# Patient Record
Sex: Female | Born: 1950
Health system: Southern US, Community
[De-identification: ages and names within clinical notes are randomized; demographics above are authoritative.]

## PROBLEM LIST (undated history)

## (undated) DIAGNOSIS — Z8669 Personal history of other diseases of the nervous system and sense organs: Secondary | ICD-10-CM

## (undated) DIAGNOSIS — I10 Essential (primary) hypertension: Secondary | ICD-10-CM

## (undated) DIAGNOSIS — E785 Hyperlipidemia, unspecified: Secondary | ICD-10-CM

## (undated) HISTORY — DX: Personal history of other diseases of the nervous system and sense organs: Z86.69

## (undated) HISTORY — DX: Hyperlipidemia, unspecified: E78.5

## (undated) HISTORY — DX: Essential (primary) hypertension: I10

---

## 1982-05-18 HISTORY — PX: CHOLECYSTECTOMY: SHX55

## 2005-04-16 ENCOUNTER — Ambulatory Visit: Payer: Self-pay | Admitting: Obstetrics and Gynecology

## 2006-05-28 ENCOUNTER — Ambulatory Visit: Payer: Self-pay | Admitting: Obstetrics and Gynecology

## 2006-11-25 ENCOUNTER — Ambulatory Visit: Payer: Self-pay | Admitting: Emergency Medicine

## 2006-11-27 ENCOUNTER — Ambulatory Visit: Payer: Self-pay | Admitting: Emergency Medicine

## 2007-08-16 ENCOUNTER — Ambulatory Visit: Payer: Self-pay | Admitting: Obstetrics and Gynecology

## 2008-09-03 ENCOUNTER — Ambulatory Visit: Payer: Self-pay | Admitting: Obstetrics and Gynecology

## 2009-10-29 ENCOUNTER — Ambulatory Visit: Payer: Self-pay | Admitting: Obstetrics and Gynecology

## 2009-11-11 ENCOUNTER — Ambulatory Visit: Payer: Self-pay | Admitting: Obstetrics and Gynecology

## 2011-08-13 ENCOUNTER — Ambulatory Visit: Payer: Self-pay | Admitting: Family Medicine

## 2011-08-13 LAB — URINALYSIS, COMPLETE
Ketone: NEGATIVE
Nitrite: NEGATIVE

## 2011-08-26 ENCOUNTER — Ambulatory Visit (INDEPENDENT_AMBULATORY_CARE_PROVIDER_SITE_OTHER): Payer: BC Managed Care – PPO | Admitting: Internal Medicine

## 2011-08-26 ENCOUNTER — Encounter: Payer: Self-pay | Admitting: Internal Medicine

## 2011-08-26 VITALS — BP 122/64 | HR 60 | Temp 98.7°F | Resp 16 | Ht 62.0 in | Wt 184.2 lb

## 2011-08-26 DIAGNOSIS — I10 Essential (primary) hypertension: Secondary | ICD-10-CM

## 2011-08-26 DIAGNOSIS — Z79899 Other long term (current) drug therapy: Secondary | ICD-10-CM

## 2011-08-26 DIAGNOSIS — E785 Hyperlipidemia, unspecified: Secondary | ICD-10-CM

## 2011-08-26 DIAGNOSIS — R05 Cough: Secondary | ICD-10-CM

## 2011-08-26 DIAGNOSIS — E663 Overweight: Secondary | ICD-10-CM | POA: Insufficient documentation

## 2011-08-26 DIAGNOSIS — T502X5A Adverse effect of carbonic-anhydrase inhibitors, benzothiadiazides and other diuretics, initial encounter: Secondary | ICD-10-CM

## 2011-08-26 DIAGNOSIS — E669 Obesity, unspecified: Secondary | ICD-10-CM

## 2011-08-26 DIAGNOSIS — N390 Urinary tract infection, site not specified: Secondary | ICD-10-CM

## 2011-08-26 DIAGNOSIS — E876 Hypokalemia: Secondary | ICD-10-CM

## 2011-08-26 LAB — BASIC METABOLIC PANEL
CO2: 30 mEq/L (ref 19–32)
GFR: 82.78 mL/min (ref >60.00)
Glucose, Bld: 82 mg/dL (ref 70–99)
Potassium: 3 mEq/L — ABNORMAL LOW (ref 3.5–5.1)
Sodium: 139 mEq/L (ref 135–145)

## 2011-08-26 NOTE — Progress Notes (Signed)
Patient ID: Alexandria Graves, female   DOB: 11-19-1950, 61 y.o.   MRN: [id number]   Patient Active Problem List  Diagnoses  . Obesity (BMI 30-39.9)  . Cough  . Diuretic-induced hypokalemia  . History of migraine headaches  . Hypertension  . Hyperlipidemia    Subjective:  CC:   Chief Complaint  Patient presents with  . Follow-up    HPI:   Alexandria Graves a 61 y.o. female who presents ffor ollow up on hypertension, hypertriglyceridemia and overweight,  Last seen July 2012 for annual physical. Recent episode of hematuria,  incontinence and URI treated by Urgent Care with augmentin on Good Friday when office was closed.  Here today to recheck.  Treated once before for urinary  urgency by Dr. Alison Murray .  Unclear if cultures were done either time.    She has no history of prior hematuria or stones.   Still has a slight cough, but thinks her symptoms are due to chronic  PND. o fevers, chills, purulent sputum, and all urinary symptoms have resolved.    Past Medical History  Diagnosis Date  . History of migraine headaches   . Hypertension   . Hyperlipidemia     Past Surgical History  Procedure Date  . Cholecystectomy 1984         The following portions of the patient's history were reviewed and updated as appropriate: Allergies, current medications, and problem list.    Review of Systems:   12 Pt  review of systems was negative except those addressed in the HPI,     History   Social History  . Marital Status: Married    Spouse Name: N/A    Number of Children: N/A  . Years of Education: N/A   Occupational History  . Not on file.   Social History Main Topics  . Smoking status: Former Smoker    Quit date: 08/26/1971  . Smokeless tobacco: Never Used  . Alcohol Use: Yes  . Drug Use: No  . Sexually Active: Not on file   Other Topics Concern  . Not on file   Social History Narrative  . No narrative on file    Objective:  BP 122/64  Pulse 60  Temp(Src) 98.7  F (37.1 C) (Oral)  Resp 16  Ht 5\' 2"  (1.575 m)  Wt 184 lb 4 oz (83.575 kg)  BMI 33.70 kg/m2  SpO2 99%  General appearance: alert, cooperative and appears stated age Ears: normal TM's and external ear canals both ears Throat: lips, mucosa, and tongue normal; teeth and gums normal Neck: no adenopathy, no carotid bruit, supple, symmetrical, trachea midline and thyroid not enlarged, symmetric, no tenderness/mass/nodules Back: symmetric, no curvature. ROM normal. No CVA tenderness. Lungs: clear to auscultation bilaterally Heart: regular rate and rhythm, S1, S2 normal, no murmur, click, rub or gallop Abdomen: soft, non-tender; bowel sounds normal; no masses,  no organomegaly Pulses: 2+ and symmetric Skin: Skin color, texture, turgor normal. No rashes or lesions Lymph nodes: Cervical, supraclavicular, and axillary nodes normal.  Assessment and Plan:  Obesity (BMI 30-39.9) Not exercising ,  Has lost 9 lbs. Not counting calories .  I have addressed  BMI and recommended a low glycemic index diet utilizing smaller more frequent meals to increase metabolism.  I have also recommended that patient start exercising with a goal of 30 minutes of aerobic exercise a minimum of 5 days per week.   Hypertension Well controlled on current medications.  No changes today. She is overdue for  BMET  Diuretic-induced hypokalemia Changed from hctz to maxzide for hypokalemia seen on today's BMET  Cough Nonproductive.  Will treat for PND and GERD with saline lavage and H2 blocker .   Hyperlipidemia Mild, managed with fish oil.  Last lipid panel in July was : LDL 111 trigs 103 HDL  54.     Updated Medication List Outpatient Encounter Prescriptions as of 08/26/2011  Medication Sig Dispense Refill  . aspirin 81 MG tablet Take 81 mg by mouth daily.      . cetirizine (ZYRTEC) 10 MG tablet Take 10 mg by mouth daily.      . potassium chloride (K-DUR) 10 MEQ tablet Take 1 tablet (10 mEq total) by mouth 2 (two)  times daily.  14 tablet  0  . propranolol ER (INDERAL LA) 120 MG 24 hr capsule Take by mouth daily. 160 mg      . triamterene-hydrochlorothiazide (MAXZIDE-25) 37.5-25 MG per tablet Take 1 each (1 tablet total) by mouth daily.  30 tablet  3  . DISCONTD: hydrochlorothiazide (HYDRODIURIL) 25 MG tablet Take 25 mg by mouth daily.         Orders Placed This Encounter  Procedures  . CULTURE, URINE COMPREHENSIVE  . HM MAMMOGRAPHY  . Basic metabolic panel  . Basic metabolic panel  . HM PAP SMEAR  . POCT Urinalysis Dipstick  . HM COLONOSCOPY    No Follow-up on file.

## 2011-08-26 NOTE — Patient Instructions (Addendum)
Consider the Low Glycemic Index Diet and 6 smaller meals daily :   7 AM Low carbohydrate Protein  Shakes (EAS Carb Control  Or Atkins ,  Available everywhere,   In  cases at [employer] )  2.5 carbs  (Add or substitute a toasted sandwhich thin w/ peanut butter)  10 AM: Protein bar by Atkins (snack size,  Chocolate lover's variety at  [employer])    Lunch: sandwich on pita bread or flatbread (Joseph's makes a pita bread and a flat bread , available at [employer] and [employer]; Toufayah makes a low carb flatbread available at [employer] and [employer])   3 PM:  Mid day :  Another protein bar,  Or a  cheese stick, 1/4 cup of almonds, walnuts, pistachios, pecans, peanuts,  Macadamia nuts  6 PM  Dinner:  "mean and green:"  Meat/chicken/fish, salad, and green veggie : use ranch, vinagrette,  Blue cheese, etc  9 PM snack : Breyer's low carb fudgiscle or  ice cream bar (Carb Smart) Weight Watcher's ice cream bar , or another protein shake \  Try simply saline twice daily  And generic pepcid 20 mg once daily n the evening for reflux to see if cough improves.Ok to continue Delsym.

## 2011-08-26 NOTE — Assessment & Plan Note (Addendum)
Not exercising ,  Has lost 9 lbs. Not counting calories .  I have addressed  BMI and recommended a low glycemic index diet utilizing smaller more frequent meals to increase metabolism.  I have also recommended that patient start exercising with a goal of 30 minutes of aerobic exercise a minimum of 5 days per week.

## 2011-08-27 ENCOUNTER — Other Ambulatory Visit: Payer: Self-pay | Admitting: Internal Medicine

## 2011-08-27 ENCOUNTER — Encounter: Payer: Self-pay | Admitting: Internal Medicine

## 2011-08-27 DIAGNOSIS — E876 Hypokalemia: Secondary | ICD-10-CM | POA: Insufficient documentation

## 2011-08-27 DIAGNOSIS — Z8669 Personal history of other diseases of the nervous system and sense organs: Secondary | ICD-10-CM | POA: Insufficient documentation

## 2011-08-27 DIAGNOSIS — E785 Hyperlipidemia, unspecified: Secondary | ICD-10-CM | POA: Insufficient documentation

## 2011-08-27 DIAGNOSIS — I10 Essential (primary) hypertension: Secondary | ICD-10-CM | POA: Insufficient documentation

## 2011-08-27 MED ORDER — POTASSIUM CHLORIDE ER 10 MEQ PO TBCR: 10.0000 meq | EXTENDED_RELEASE_TABLET | Freq: Two times a day (BID) | ORAL | Status: DC

## 2011-08-27 MED ORDER — TRIAMTERENE-HCTZ 37.5-25 MG PO TABS: 1.0000 | ORAL_TABLET | Freq: Every day | ORAL | Status: DC

## 2011-08-27 NOTE — Assessment & Plan Note (Signed)
Changed from hctz to maxzide for hypokalemia seen on today's BMET

## 2011-08-27 NOTE — Assessment & Plan Note (Signed)
Mild, managed with fish oil.  Last lipid panel in July was : LDL 111 trigs 103 HDL  54.

## 2011-08-27 NOTE — Assessment & Plan Note (Addendum)
Well controlled on current medications.  No changes today. She is overdue for BMET

## 2011-08-27 NOTE — Assessment & Plan Note (Signed)
Nonproductive.  Will treat for PND and GERD with saline lavage and H2 blocker .

## 2011-08-28 ENCOUNTER — Telehealth: Payer: Self-pay | Admitting: *Deleted

## 2011-08-28 LAB — POCT URINALYSIS DIPSTICK
Bilirubin, UA: NEGATIVE
Blood, UA: NEGATIVE
Glucose, UA: NEGATIVE
Nitrite, UA: NEGATIVE
Urobilinogen, UA: 0.2
pH, UA: 7

## 2011-08-28 NOTE — Telephone Encounter (Signed)
Patient's urine was sent for culture.

## 2011-08-28 NOTE — Telephone Encounter (Signed)
Ok, can you confirm that is it sent for culture? thanks

## 2011-08-28 NOTE — Telephone Encounter (Signed)
The urinalysis should show up now. I'm not sure why it didn't release.

## 2011-08-31 LAB — CULTURE, URINE COMPREHENSIVE: Colony Count: 50000

## 2011-09-10 ENCOUNTER — Encounter: Payer: Self-pay | Admitting: Internal Medicine

## 2011-09-22 ENCOUNTER — Other Ambulatory Visit (INDEPENDENT_AMBULATORY_CARE_PROVIDER_SITE_OTHER): Payer: BC Managed Care – PPO | Admitting: *Deleted

## 2011-09-22 DIAGNOSIS — E876 Hypokalemia: Secondary | ICD-10-CM

## 2011-09-22 DIAGNOSIS — T502X5A Adverse effect of carbonic-anhydrase inhibitors, benzothiadiazides and other diuretics, initial encounter: Secondary | ICD-10-CM

## 2011-09-22 LAB — BASIC METABOLIC PANEL
BUN: 14 mg/dL (ref 6–23)
CO2: 31 mEq/L (ref 19–32)
Chloride: 104 mEq/L (ref 96–112)
Creatinine, Ser: 0.8 mg/dL (ref 0.4–1.2)
Glucose, Bld: 105 mg/dL — ABNORMAL HIGH (ref 70–99)

## 2011-09-23 ENCOUNTER — Other Ambulatory Visit: Payer: BC Managed Care – PPO

## 2011-11-05 ENCOUNTER — Ambulatory Visit: Payer: Self-pay | Admitting: Obstetrics and Gynecology

## 2011-12-12 LAB — HM PAP SMEAR: HM Pap smear: NORMAL

## 2012-02-23 ENCOUNTER — Other Ambulatory Visit: Payer: Self-pay | Admitting: Internal Medicine

## 2012-02-23 DIAGNOSIS — E876 Hypokalemia: Secondary | ICD-10-CM

## 2012-02-23 MED ORDER — PROPRANOLOL HCL ER 120 MG PO CP24: 120.0000 mg | ORAL_CAPSULE | Freq: Every day | ORAL | Status: DC

## 2012-02-23 NOTE — Telephone Encounter (Signed)
Patient had to cancel her appointment this Friday because her father was diagnosed with cancer and she has to take him to the doctor. She is needing her prescriptions refilled before she can be seen in the office. Refill on Propranolol Er 120 mg cap time release. HCTZ . 25 mg. Send to [facility].

## 2012-02-23 NOTE — Telephone Encounter (Signed)
Patient had to cancel her appointment this Friday because her father was diagnosed with cancer and she has to take him to the doctor. She is needing her prescriptions refilled before she can be seen in the office. °Refill on Propranolol Er 120 mg cap time release. HCTZ . 25 mg. Send to [facility]. °

## 2012-02-24 ENCOUNTER — Other Ambulatory Visit: Payer: Self-pay | Admitting: Internal Medicine

## 2012-02-24 ENCOUNTER — Telehealth: Payer: Self-pay | Admitting: Internal Medicine

## 2012-02-24 MED ORDER — PROPRANOLOL HCL ER 120 MG PO CP24: 120.0000 mg | ORAL_CAPSULE | Freq: Every day | ORAL | Status: DC

## 2012-02-24 NOTE — Telephone Encounter (Signed)
Received fax from [facility] asking [place] to confirm Propranolol ER. I have put this form in your red folder for you to fill out and then we can fax back.

## 2012-02-26 ENCOUNTER — Ambulatory Visit: Payer: BC Managed Care – PPO | Admitting: Internal Medicine

## 2012-03-03 MED ORDER — TRIAMTERENE-HCTZ 37.5-25 MG PO TABS: 1.0000 | ORAL_TABLET | Freq: Every day | ORAL | Status: DC

## 2012-03-03 NOTE — Telephone Encounter (Signed)
Rhonda  Per your note you sent a refill of HCTZ to [facility],.  This patient does not take HCTZ,  She takes triamterene/HTCZ 37.5/25mg ., since April 2013.  I have sent to [facility].  You did not update [facility] when you sent so I cannot tell whether you sent HCZ or not.  You will need to call them and cancel HTCZ if so.

## 2012-03-03 NOTE — Addendum Note (Signed)
Addended by: Sherlene Shams on: 03/03/2012 05:29 PM   Modules accepted: Orders

## 2012-03-07 ENCOUNTER — Other Ambulatory Visit: Payer: Self-pay

## 2012-03-09 ENCOUNTER — Encounter: Payer: Self-pay | Admitting: Internal Medicine

## 2012-03-09 ENCOUNTER — Encounter: Payer: Self-pay | Admitting: *Deleted

## 2012-03-09 ENCOUNTER — Ambulatory Visit (INDEPENDENT_AMBULATORY_CARE_PROVIDER_SITE_OTHER): Payer: BC Managed Care – PPO | Admitting: Internal Medicine

## 2012-03-09 VITALS — BP 122/72 | HR 59 | Temp 98.6°F | Ht 62.0 in | Wt 188.0 lb

## 2012-03-09 DIAGNOSIS — K222 Esophageal obstruction: Secondary | ICD-10-CM | POA: Insufficient documentation

## 2012-03-09 DIAGNOSIS — F419 Anxiety disorder, unspecified: Secondary | ICD-10-CM

## 2012-03-09 DIAGNOSIS — I10 Essential (primary) hypertension: Secondary | ICD-10-CM

## 2012-03-09 DIAGNOSIS — R1310 Dysphagia, unspecified: Secondary | ICD-10-CM

## 2012-03-09 DIAGNOSIS — F411 Generalized anxiety disorder: Secondary | ICD-10-CM

## 2012-03-09 DIAGNOSIS — E669 Obesity, unspecified: Secondary | ICD-10-CM

## 2012-03-09 LAB — COMPREHENSIVE METABOLIC PANEL
AST: 19 U/L (ref 0–37)
Albumin: 3.7 g/dL (ref 3.5–5.2)
Alkaline Phosphatase: 42 U/L (ref 39–117)
BUN: 13 mg/dL (ref 6–23)
Creatinine, Ser: 0.8 mg/dL (ref 0.4–1.2)
Glucose, Bld: 101 mg/dL — ABNORMAL HIGH (ref 70–99)
Potassium: 3.8 mEq/L (ref 3.5–5.1)
Total Bilirubin: 0.5 mg/dL (ref 0.3–1.2)

## 2012-03-09 LAB — MICROALBUMIN / CREATININE URINE RATIO
Creatinine,U: 83.6 mg/dL
Microalb Creat Ratio: 1.3 mg/g (ref 0.0–30.0)
Microalb, Ur: 1.1 mg/dL (ref 0.0–1.9)

## 2012-03-09 NOTE — Patient Instructions (Addendum)
If potassium is fine today, we weil continueu hctz.  If not, we will change to the one that spares potassium.   Ask your father's  Doctor for a care managemetn consult to arrange Hospice and Palliative Care

## 2012-03-09 NOTE — Progress Notes (Signed)
Patient ID: Alexandria Graves, female   DOB: 07/06/1950, 61 y.o.   MRN: [id number]  Patient Active Problem List  Diagnosis  . Obesity (BMI 30-39.9)  . Cough  . Diuretic-induced hypokalemia  . History of migraine headaches  . Hypertension  . Hyperlipidemia  . Dysphagia    Subjective:  CC:   Chief Complaint  Patient presents with  . Follow-up    HPI:   Alexandria Goochis a 61 y.o. female who presents Followup on acute and chronic problems. For The last several days she has been feeling an external pressure on manubrium.,  And occasional heartburn.    Eating late often which aggravates it.  Accompanied by feeling that food is getting stuck on the way down.  No alcohol or tobacc use except rare alcohol.  She is distraught  Stressed out,  Father has been diagnosed with metastatic prostate cancer and was hospitalized ysterday at [facility] for septic shock.  He has asked her to let him die and  she has to make end of life decisions for him .  The physicians have not offered yet.    Past Medical History  Diagnosis Date  . History of migraine headaches   . Hypertension   . Hyperlipidemia     Past Surgical History  Procedure Date  . Cholecystectomy 1984         The following portions of the patient's history were reviewed and updated as appropriate: Allergies, current medications, and problem list.    Review of Systems:   12 Pt  review of systems was negative except those addressed in the HPI,     History   Social History  . Marital Status: Married    Spouse Name: N/A    Number of Children: N/A  . Years of Education: N/A   Occupational History  . Not on file.   Social History Main Topics  . Smoking status: Former Smoker    Quit date: 08/26/1971  . Smokeless tobacco: Never Used  . Alcohol Use: Yes  . Drug Use: No  . Sexually Active: Not on file   Other Topics Concern  . Not on file   Social History Narrative  . No narrative on file    Objective:  BP 122/72   Pulse 59  Temp 98.6 F (37 C)  Ht 5\' 2"  (1.575 m)  Wt 188 lb (85.276 kg)  BMI 34.39 kg/m2  SpO2 97%  General appearance: alert, cooperative and appears stated age Ears: normal TM's and external ear canals both ears Throat: lips, mucosa, and tongue normal; teeth and gums normal Neck: no adenopathy, no carotid bruit, supple, symmetrical, trachea midline and thyroid not enlarged, symmetric, no tenderness/mass/nodules Back: symmetric, no curvature. ROM normal. No CVA tenderness. Lungs: clear to auscultation bilaterally Heart: regular rate and rhythm, S1, S2 normal, no murmur, click, rub or gallop Abdomen: soft, non-tender; bowel sounds normal; no masses,  no organomegaly Pulses: 2+ and symmetric Skin: Skin color, texture, turgor normal. No rashes or lesions Lymph nodes: Cervical, supraclavicular, and axillary nodes normal.  Assessment and Plan: Dysphagia Suggestive of stricture versus esophageal spasm. Barium swallow ordered.  We will send the results to [facility] endoscopy. Advised to continue a PPI with OTC omeprazolae  Hypertension well controlled. Potassium is normal so we will continue hydrochlorothiazide.   Obesity (BMI 30-39.9) I have addressed  BMI and recommended a low glycemic index diet utilizing smaller more frequent meals to increase metabolism.  I have also recommended that patient start exercising  with a goal of 30 minutes of aerobic exercise a minimum of 5 days per week. Screening for diabetes shows slightly elevated cbgs, hgba1c of 60    Anxiety Spent 15 minutes discussing her to role in her father's death. Recommended that she ask her father's physician to engage care management for hospice and palliative services. She was offered an anxiolytic to help her rest but she's afraid she will sleep through important call.   Updated Medication List Outpatient Encounter Prescriptions as of 03/09/2012  Medication Sig Dispense Refill  . aspirin 81 MG  tablet Take 81 mg by mouth daily.      . cetirizine (ZYRTEC) 10 MG tablet Take 10 mg by mouth daily.      . propranolol ER (INDERAL LA) 120 MG 24 hr capsule Take 1 capsule (120 mg total) by mouth daily.  30 capsule  11  . triamterene-hydrochlorothiazide (MAXZIDE-25) 37.5-25 MG per tablet Take 1 each (1 tablet total) by mouth daily.  90 tablet  3  . DISCONTD: potassium chloride (K-DUR) 10 MEQ tablet Take 1 tablet (10 mEq total) by mouth 2 (two) times daily.  14 tablet  0

## 2012-03-11 ENCOUNTER — Encounter: Payer: Self-pay | Admitting: Internal Medicine

## 2012-03-11 DIAGNOSIS — F419 Anxiety disorder, unspecified: Secondary | ICD-10-CM | POA: Insufficient documentation

## 2012-03-11 NOTE — Assessment & Plan Note (Addendum)
I have addressed  BMI and recommended a low glycemic index diet utilizing smaller more frequent meals to increase metabolism.  I have also recommended that patient start exercising with a goal of 30 minutes of aerobic exercise a minimum of 5 days per week. Screening for diabetes shows slightly elevated cbgs, hgba1c of 60

## 2012-03-11 NOTE — Assessment & Plan Note (Signed)
well controlled  

## 2012-03-11 NOTE — Assessment & Plan Note (Addendum)
Suggestive of stricture versus esophageal spasm. Barium swallow ordered.  We will send the results to [facility] endoscopy. Advised to continue a PPI with OTC omeprazolae

## 2012-03-11 NOTE — Assessment & Plan Note (Signed)
Spent 15 minutes discussing her to role in her father's death. Recommended that she ask her father's physician to engage care management for hospice and palliative services. She was offered an anxiolytic to help her rest but she's afraid she will sleep through important call.

## 2012-03-12 ENCOUNTER — Encounter: Payer: Self-pay | Admitting: Internal Medicine

## 2012-03-24 ENCOUNTER — Ambulatory Visit: Payer: Self-pay

## 2012-03-24 LAB — RAPID STREP-A WITH REFLX: Micro Text Report: NEGATIVE

## 2012-04-25 ENCOUNTER — Encounter: Payer: Self-pay | Admitting: Internal Medicine

## 2012-04-27 MED ORDER — HYDROCHLOROTHIAZIDE 25 MG PO TABS: 25.0000 mg | ORAL_TABLET | Freq: Every day | ORAL | Status: DC

## 2012-05-03 ENCOUNTER — Encounter: Payer: Self-pay | Admitting: Internal Medicine

## 2012-05-04 ENCOUNTER — Other Ambulatory Visit: Payer: Self-pay

## 2012-05-04 MED ORDER — HYDROCHLOROTHIAZIDE 25 MG PO TABS: 25.0000 mg | ORAL_TABLET | Freq: Every day | ORAL | Status: DC

## 2012-05-04 NOTE — Telephone Encounter (Signed)
Patient requested refill on Hydrochlorthiazide 25 mg # [facility]

## 2012-05-05 MED ORDER — PROPRANOLOL HCL ER 120 MG PO CP24: 120.0000 mg | ORAL_CAPSULE | Freq: Every day | ORAL | Status: DC

## 2012-06-04 ENCOUNTER — Ambulatory Visit: Payer: Self-pay | Admitting: Family Medicine

## 2012-06-13 ENCOUNTER — Encounter: Payer: Self-pay | Admitting: Internal Medicine

## 2012-06-13 ENCOUNTER — Ambulatory Visit (INDEPENDENT_AMBULATORY_CARE_PROVIDER_SITE_OTHER): Payer: BC Managed Care – PPO | Admitting: Internal Medicine

## 2012-06-13 VITALS — BP 124/80 | HR 62 | Temp 97.8°F | Resp 16 | Wt 192.5 lb

## 2012-06-13 DIAGNOSIS — R3915 Urgency of urination: Secondary | ICD-10-CM

## 2012-06-13 DIAGNOSIS — R319 Hematuria, unspecified: Secondary | ICD-10-CM | POA: Insufficient documentation

## 2012-06-13 LAB — POCT URINALYSIS DIPSTICK
Glucose, UA: NEGATIVE
Nitrite, UA: NEGATIVE
Protein, UA: NEGATIVE
Urobilinogen, UA: 0.2

## 2012-06-13 MED ORDER — BENZONATATE 100 MG PO CAPS: 100.0000 mg | ORAL_CAPSULE | Freq: Three times a day (TID) | ORAL | Status: DC | PRN

## 2012-06-13 MED ORDER — LEVOFLOXACIN 500 MG PO TABS: 500.0000 mg | ORAL_TABLET | Freq: Every day | ORAL | Status: DC

## 2012-06-13 MED ORDER — GUAIFENESIN-CODEINE 100-10 MG/5ML PO SYRP: 5.0000 mL | ORAL_SOLUTION | Freq: Four times a day (QID) | ORAL | Status: DC | PRN

## 2012-06-13 NOTE — Progress Notes (Signed)
Patient ID: Alexandria Graves, female   DOB: Dec 10, 1950, 62 y.o.   MRN: [id number] Patient Active Problem List  Diagnosis  . Obesity (BMI 30-39.9)  . Cough  . Diuretic-induced hypokalemia  . History of migraine headaches  . Hypertension  . Hyperlipidemia  . Dysphagia  . Anxiety  . Painless hematuria    Subjective:  CC:   Chief Complaint  Patient presents with  . Urinary Tract Infection    HPI:   Alexandria Goochis a 62 y.o. female who presents with a  2 week history of intermittent hematuria.  Patients states for she has been having stress incontinence for the past 2 weeks due to bronchitis.   When she returned home from the Urgent Care visit she noticed pink staining on her  Sanitary pad.  She has had several episodes over the last week of bleeding.  She denies bladder pain,  back pain pelvic pain and dysuria.  Her last pelvic exam was normal last summer by Dr. Logan Graves and she has deferred a pelvic exam today.    2) She was treated for a viral UTI at an urgent care With tessalon and a codeine containing cough syrup which she is nearly out of.  The cough has improved but has not resolved yet and is controlled with simultaneous use of both cough suppressants.   Past Medical History  Diagnosis Date  . History of migraine headaches   . Hypertension   . Hyperlipidemia     Past Surgical History  Procedure Date  . Cholecystectomy 1984   The following portions of the patient's history were reviewed and updated as appropriate: Allergies, current medications, and problem list.    Review of Systems:   12 Pt  review of systems was negative except those addressed in the HPI,     History   Social History  . Marital Status: Married    Spouse Name: N/A    Number of Children: N/A  . Years of Education: N/A   Occupational History  . Not on file.   Social History Main Topics  . Smoking status: Former Smoker    Quit date: 08/26/1971  . Smokeless tobacco: Never Used  . Alcohol Use:  Yes  . Drug Use: No  . Sexually Active: Not on file   Other Topics Concern  . Not on file   Social History Narrative  . No narrative on file    Objective:  BP 124/80  Pulse 62  Temp 97.8 F (36.6 C) (Oral)  Resp 16  Wt 192 lb 8 oz (87.317 kg)  SpO2 96%  General appearance: alert, cooperative and appears stated age Ears: normal TM's and external ear canals both ears Throat: lips, mucosa, and tongue normal; teeth and gums normal Neck: no adenopathy, no carotid bruit, supple, symmetrical, trachea midline and thyroid not enlarged, symmetric, no tenderness/mass/nodules Back: symmetric, no curvature. ROM normal. No CVA tenderness. Lungs: clear to auscultation bilaterally Heart: regular rate and rhythm, S1, S2 normal, no murmur, click, rub or gallop Abdomen: soft, non-tender; bowel sounds normal; no masses,  no organomegaly Pulses: 2+ and symmetric Skin: Skin color, texture, turgor normal. No rashes or lesions Lymph nodes: Cervical, supraclavicular, and axillary nodes normal.  Assessment and Plan:  Painless hematuria Spot ua was normal.  She has no pain to suggest kidney stone. She did not want to have a pelvic exam today but has agreed to place a tampon intravaginally for a few hours to rule out vaginal source.  If negative she  will need urology evaluation vs CT of abdomen and pelvis to rule out bladder source which would include cancer, asymptomatic stone.    Updated Medication List Outpatient Encounter Prescriptions as of 06/13/2012  Medication Sig Dispense Refill  . aspirin 81 MG tablet Take 81 mg by mouth daily.      . cetirizine (ZYRTEC) 10 MG tablet Take 10 mg by mouth daily.      . hydrochlorothiazide (HYDRODIURIL) 25 MG tablet Take 1 tablet (25 mg total) by mouth daily.  90 tablet  0  . OMEPRAZOLE PO Take 1 tablet by mouth daily.      . propranolol ER (INDERAL LA) 120 MG 24 hr capsule Take 1 capsule (120 mg total) by mouth daily.  90 capsule  3  . benzonatate  (TESSALON) 100 MG capsule Take 1 capsule (100 mg total) by mouth 3 (three) times daily as needed for cough.  60 capsule  0  . guaiFENesin-codeine (CHERATUSSIN AC) 100-10 MG/5ML syrup Take 5 mLs by mouth 4 (four) times daily as needed for cough.  200 mL  0  . levofloxacin (LEVAQUIN) 500 MG tablet Take 1 tablet (500 mg total) by mouth daily.  7 tablet  0     Orders Placed This Encounter  Procedures  . HM PAP SMEAR  . POCT urinalysis dipstick    No Follow-up on file.

## 2012-06-13 NOTE — Patient Instructions (Signed)
There was no evidence of infection on your urine dipsitck   The bleeding  may be vaginal , so place a tampon for a few hours to see if the blood is coming from there  Start the antibiotic only if you have cramping burning or frequency.

## 2012-06-14 NOTE — Assessment & Plan Note (Signed)
Spot ua was normal.  She has no pain to suggest kidney stone. She did not want to have a pelvic exam today but has agreed to place a tampon intravaginally for a few hours to rule out vaginal source.  If negative she will need urology evaluation vs CT of abdomen and pelvis to rule out bladder source which would include cancer, asymptomatic stone.

## 2012-07-02 ENCOUNTER — Other Ambulatory Visit: Payer: Self-pay

## 2012-09-07 ENCOUNTER — Ambulatory Visit: Payer: BC Managed Care – PPO | Admitting: Internal Medicine

## 2012-09-20 ENCOUNTER — Ambulatory Visit: Payer: BC Managed Care – PPO | Admitting: Internal Medicine

## 2012-10-12 ENCOUNTER — Ambulatory Visit (INDEPENDENT_AMBULATORY_CARE_PROVIDER_SITE_OTHER): Payer: BC Managed Care – PPO | Admitting: Internal Medicine

## 2012-10-12 ENCOUNTER — Encounter: Payer: Self-pay | Admitting: Internal Medicine

## 2012-10-12 VITALS — BP 154/98 | HR 61 | Temp 98.4°F | Resp 16 | Wt 185.5 lb

## 2012-10-12 DIAGNOSIS — E669 Obesity, unspecified: Secondary | ICD-10-CM

## 2012-10-12 DIAGNOSIS — E785 Hyperlipidemia, unspecified: Secondary | ICD-10-CM

## 2012-10-12 DIAGNOSIS — R7309 Other abnormal glucose: Secondary | ICD-10-CM

## 2012-10-12 DIAGNOSIS — R739 Hyperglycemia, unspecified: Secondary | ICD-10-CM

## 2012-10-12 DIAGNOSIS — Z79899 Other long term (current) drug therapy: Secondary | ICD-10-CM

## 2012-10-12 DIAGNOSIS — I10 Essential (primary) hypertension: Secondary | ICD-10-CM

## 2012-10-12 DIAGNOSIS — Z5189 Encounter for other specified aftercare: Secondary | ICD-10-CM

## 2012-10-12 LAB — COMPREHENSIVE METABOLIC PANEL
ALT: 41 U/L — ABNORMAL HIGH (ref 0–35)
Albumin: 4.2 g/dL (ref 3.5–5.2)
Alkaline Phosphatase: 40 U/L (ref 39–117)
CO2: 33 mEq/L — ABNORMAL HIGH (ref 19–32)
Glucose, Bld: 104 mg/dL — ABNORMAL HIGH (ref 70–99)
Potassium: 3.5 mEq/L (ref 3.5–5.1)
Sodium: 139 mEq/L (ref 135–145)
Total Bilirubin: 0.6 mg/dL (ref 0.3–1.2)
Total Protein: 7.9 g/dL (ref 6.0–8.3)

## 2012-10-12 LAB — LIPID PANEL
HDL: 43.9 mg/dL (ref >39.00)
Total CHOL/HDL Ratio: 4
VLDL: 21.6 mg/dL (ref 0.0–40.0)

## 2012-10-12 LAB — HEMOGLOBIN A1C: Hgb A1c MFr Bld: 6 % (ref 4.6–6.5)

## 2012-10-12 MED ORDER — FUROSEMIDE 20 MG PO TABS: ORAL_TABLET | ORAL | Status: DC

## 2012-10-12 MED ORDER — POTASSIUM CHLORIDE CRYS ER 20 MEQ PO TBCR: 20.0000 meq | EXTENDED_RELEASE_TABLET | Freq: Every day | ORAL | Status: DC

## 2012-10-12 NOTE — Assessment & Plan Note (Addendum)
Well controlled on current regimen historically but elevated today . Renal function stable, no changes today. Home bps to be checked and changes made if elevated.

## 2012-10-12 NOTE — Assessment & Plan Note (Addendum)
Resolved,  But likely to drop with use of prn furosemide. Supplemental potassium

## 2012-10-12 NOTE — Assessment & Plan Note (Signed)
Recommending a  10% weight loss over the next 6 months

## 2012-10-12 NOTE — Patient Instructions (Addendum)
Good job on the weight loss!!!!    Check bps at home over then next month.  If consistently > 130/80 either number.   E mail me and I will add something   rx for furosemide sent to [facility],  Take as needed for fluid retention  Typically a weight loss of 10% (19 lbs total) will produce an effect on blood pressure that my result in stopping meds!!

## 2012-10-12 NOTE — Assessment & Plan Note (Signed)
Well-controlled on current regimen. ?

## 2012-10-12 NOTE — Progress Notes (Signed)
Patient ID: Alexandria Graves, female   DOB: 1950/07/12, 62 y.o.   MRN: [id number]  Patient Active Problem List   Diagnosis Date Noted  . Painless hematuria 06/13/2012  . Anxiety 03/11/2012  . Dysphagia 03/09/2012  . Diuretic-induced hypokalemia 08/27/2011  . History of migraine headaches   . Hypertension   . Hyperlipidemia   . Obesity (BMI 30-39.9) 08/26/2011  . Cough 08/26/2011    Subjective:  CC:   Chief Complaint  Patient presents with  . Follow-up    6 month    HPI:   Alexandria Graves a 62 y.o. female who presents 6 month follow up on hyperglycemia , hyperlipidemia and obesity.  She has had no recurrent hematuria. lost 7 lbs since January.  No new issues except occasional LE edema which she has been treating with additional HCTZ doses prn . She is walking several times weekly without joint pain or chest pain.    Past Medical History  Diagnosis Date  . History of migraine headaches   . Hypertension   . Hyperlipidemia     Past Surgical History  Procedure Laterality Date  . Cholecystectomy  1984       The following portions of the patient's history were reviewed and updated as appropriate: Allergies, current medications, and problem list.    Review of Systems:   Patient denies headache, fevers, malaise, unintentional weight loss, skin rash, eye pain, sinus congestion and sinus pain, sore throat, dysphagia,  hemoptysis , cough, dyspnea, wheezing, chest pain, palpitations, orthopnea, edema, abdominal pain, nausea, melena, diarrhea, constipation, flank pain, dysuria, hematuria, urinary  Frequency, nocturia, numbness, tingling, seizures,  Focal weakness, Loss of consciousness,  Tremor, insomnia, depression, anxiety, and suicidal ideation.     History   Social History  . Marital Status: Married    Spouse Name: N/A    Number of Children: N/A  . Years of Education: N/A   Occupational History  . Not on file.   Social History Main Topics  . Smoking status: Former  Smoker    Quit date: 08/26/1971  . Smokeless tobacco: Never Used  . Alcohol Use: Yes  . Drug Use: No  . Sexually Active: Not on file   Other Topics Concern  . Not on file   Social History Narrative  . No narrative on file    Objective:  BP 154/98  Pulse 61  Temp(Src) 98.4 F (36.9 C) (Oral)  Resp 16  Wt 185 lb 8 oz (84.142 kg)  BMI 33.92 kg/m2  SpO2 98%  General appearance: alert, cooperative and appears stated age Ears: normal TM's and external ear canals both ears Throat: lips, mucosa, and tongue normal; teeth and gums normal Neck: no adenopathy, no carotid bruit, supple, symmetrical, trachea midline and thyroid not enlarged, symmetric, no tenderness/mass/nodules Back: symmetric, no curvature. ROM normal. No CVA tenderness. Lungs: clear to auscultation bilaterally Heart: regular rate and rhythm, S1, S2 normal, no murmur, click, rub or gallop Abdomen: soft, non-tender; bowel sounds normal; no masses,  no organomegaly Pulses: 2+ and symmetric Skin: Skin color, texture, turgor normal. No rashes or lesions Lymph nodes: Cervical, supraclavicular, and axillary nodes normal.  Assessment and Plan:  Hypertension Well controlled on current regimen historically but elevated today . Renal function stable, no changes today. Home bps to be checked and changes made if elevated.   Obesity (BMI 30-39.9) Recommending a  10% weight loss over the next 6 months  Hyperlipidemia Well controlled on current regimen.   Diuretic-induced hypokalemia Resolved,  But likely  to drop with use of prn furosemide. Supplemental potassium    Updated Medication List Outpatient Encounter Prescriptions as of 10/12/2012  Medication Sig Dispense Refill  . cetirizine (ZYRTEC) 10 MG tablet Take 10 mg by mouth daily.      . hydrochlorothiazide (HYDRODIURIL) 25 MG tablet Take 1 tablet (25 mg total) by mouth daily.  90 tablet  0  . OMEPRAZOLE PO Take 1 tablet by mouth daily.      . propranolol ER (INDERAL  LA) 120 MG 24 hr capsule Take 1 capsule (120 mg total) by mouth daily.  90 capsule  3  . aspirin 81 MG tablet Take 81 mg by mouth daily.      . benzonatate (TESSALON) 100 MG capsule Take 1 capsule (100 mg total) by mouth 3 (three) times daily as needed for cough.  60 capsule  0  . furosemide (LASIX) 20 MG tablet As needed for fluid retention  30 tablet  3  . guaiFENesin-codeine (CHERATUSSIN AC) 100-10 MG/5ML syrup Take 5 mLs by mouth 4 (four) times daily as needed for cough.  200 mL  0  . levofloxacin (LEVAQUIN) 500 MG tablet Take 1 tablet (500 mg total) by mouth daily.  7 tablet  0  . potassium chloride SA (K-DUR,KLOR-CON) 20 MEQ tablet Take 1 tablet (20 mEq total) by mouth daily. As needed t  30 tablet  3   No facility-administered encounter medications on file as of 10/12/2012.     Orders Placed This Encounter  Procedures  . Hemoglobin A1c  . Lipid panel  . Comprehensive metabolic panel    Return in about 3 months (around 01/12/2013).

## 2012-10-14 ENCOUNTER — Telehealth: Payer: Self-pay

## 2012-10-14 NOTE — Telephone Encounter (Signed)
My Chart Message: Your cholesterol is fine, A1c is unchanged at 6.0 (incrased risk for diabetes). Your fasting glucoses are elevated but not diagnostic for diabetes. Continue daily exercise, Make wt loss goal 10 % of current body weight over the next 6 months using a low GI diet. . Repeat labs in 6 months.   Notified patient of recent labs ^^^^^^^^^^^^^^

## 2012-10-18 ENCOUNTER — Other Ambulatory Visit: Payer: Self-pay | Admitting: *Deleted

## 2012-10-18 MED ORDER — POTASSIUM CHLORIDE CRYS ER 20 MEQ PO TBCR: 20.0000 meq | EXTENDED_RELEASE_TABLET | Freq: Every day | ORAL | Status: DC

## 2012-10-19 ENCOUNTER — Telehealth: Payer: Self-pay

## 2012-10-19 NOTE — Telephone Encounter (Signed)
Mardelle Matte with [facility] left v/m requesting cb using ref # [id number]. Spoke with Ellie at [facility] advised Dr Darrick Huntsman is at [facility] office [phone] and fax [phone]. Quitman Livings will have someone contact [place] office.

## 2012-10-20 ENCOUNTER — Telehealth: Payer: Self-pay | Admitting: *Deleted

## 2012-10-25 NOTE — Telephone Encounter (Signed)
Script sent  

## 2012-11-01 ENCOUNTER — Telehealth: Payer: Self-pay | Admitting: Internal Medicine

## 2012-11-01 ENCOUNTER — Other Ambulatory Visit: Payer: Self-pay | Admitting: *Deleted

## 2012-11-01 MED ORDER — PROPRANOLOL HCL ER 120 MG PO CP24: 120.0000 mg | ORAL_CAPSULE | Freq: Every day | ORAL | Status: DC

## 2012-11-01 NOTE — Telephone Encounter (Signed)
propranolol ER (INDERAL LA) 120 MG 24 hr capsule #30  [facility] 90 day refill  And a 30 [facility]

## 2012-11-01 NOTE — Telephone Encounter (Signed)
done

## 2012-12-07 ENCOUNTER — Ambulatory Visit: Payer: Self-pay | Admitting: Obstetrics and Gynecology

## 2013-01-17 ENCOUNTER — Ambulatory Visit: Payer: BC Managed Care – PPO | Admitting: Internal Medicine

## 2013-01-19 ENCOUNTER — Encounter: Payer: Self-pay | Admitting: Internal Medicine

## 2013-01-19 ENCOUNTER — Ambulatory Visit (INDEPENDENT_AMBULATORY_CARE_PROVIDER_SITE_OTHER): Payer: BC Managed Care – PPO | Admitting: Internal Medicine

## 2013-01-19 VITALS — BP 148/82 | HR 66 | Temp 98.1°F | Resp 14 | Ht 62.0 in | Wt 178.5 lb

## 2013-01-19 DIAGNOSIS — R7303 Prediabetes: Secondary | ICD-10-CM | POA: Insufficient documentation

## 2013-01-19 DIAGNOSIS — R609 Edema, unspecified: Secondary | ICD-10-CM

## 2013-01-19 DIAGNOSIS — E669 Obesity, unspecified: Secondary | ICD-10-CM

## 2013-01-19 DIAGNOSIS — I10 Essential (primary) hypertension: Secondary | ICD-10-CM

## 2013-01-19 DIAGNOSIS — R7301 Impaired fasting glucose: Secondary | ICD-10-CM

## 2013-01-19 DIAGNOSIS — Z79899 Other long term (current) drug therapy: Secondary | ICD-10-CM

## 2013-01-19 LAB — COMPREHENSIVE METABOLIC PANEL
Albumin: 4.1 g/dL (ref 3.5–5.2)
Alkaline Phosphatase: 38 U/L — ABNORMAL LOW (ref 39–117)
BUN: 12 mg/dL (ref 6–23)
GFR: 93.67 mL/min (ref >60.00)
Glucose, Bld: 89 mg/dL (ref 70–99)
Potassium: 3.7 mEq/L (ref 3.5–5.1)
Total Bilirubin: 0.7 mg/dL (ref 0.3–1.2)

## 2013-01-19 LAB — MICROALBUMIN / CREATININE URINE RATIO
Creatinine,U: 218.6 mg/dL
Microalb, Ur: 2.2 mg/dL — ABNORMAL HIGH (ref 0.0–1.9)

## 2013-01-19 LAB — LIPID PANEL
Cholesterol: 186 mg/dL (ref 0–200)
LDL Cholesterol: 123 mg/dL — ABNORMAL HIGH (ref 0–99)
Triglycerides: 82 mg/dL (ref 0.0–149.0)
VLDL: 16.4 mg/dL (ref 0.0–40.0)

## 2013-01-19 NOTE — Patient Instructions (Addendum)
Your goal is to get down to 160 lbs by March.  This will lower your BMI to < 30 (no more "obesity" flags)    You have done great!  16 lbs so far   Goal with exercise is 30 minutes of aerobic activity 5 days per week  Try the DANNON LIGHT N FIT Greek yogurt and add walnuts or pecans to it .  Great lunch,  < 10 carbs and under 200 cal

## 2013-01-19 NOTE — Assessment & Plan Note (Signed)
Improving B<I.  Goal is 168 lbs  For BMI < 30

## 2013-01-21 ENCOUNTER — Encounter: Payer: Self-pay | Admitting: Internal Medicine

## 2013-01-21 MED ORDER — LISINOPRIL 5 MG PO TABS: 5.0000 mg | ORAL_TABLET | Freq: Every day | ORAL | Status: DC

## 2013-01-21 NOTE — Addendum Note (Signed)
Addended by: Sherlene Shams on: 01/21/2013 06:41 PM   Modules accepted: Orders

## 2013-01-21 NOTE — Assessment & Plan Note (Addendum)
Elevated today.  Reviewed list of meds, patient is not taking OTC meds that could be causing it. Given her history of impaired fasting glucose and her microscopic proteinuria I will add lisinopril to her current hydrochlorothiazide dose.

## 2013-01-21 NOTE — Assessment & Plan Note (Signed)
A1c remains in the normal range at 5.9 but she has concurrent  hypertension hyperlipidemia and obesity and would benefit from an annual eye exam. She had a foot exam today which was normal. Lipids are borderline

## 2013-01-21 NOTE — Progress Notes (Signed)
Patient ID: Alexandria Graves, female   DOB: [birth year], 62 y.o.   MRN: [id number]  Patient Active Problem List   Diagnosis Date Noted  . Edema 01/19/2013  . Impaired fasting glucose 01/19/2013  . Painless hematuria 06/13/2012  . Anxiety 03/11/2012  . Dysphagia 03/09/2012  . Diuretic-induced hypokalemia 08/27/2011  . History of migraine headaches   . Hypertension   . Hyperlipidemia   . Obesity (BMI 30-39.9) 08/26/2011  . Cough 08/26/2011    Subjective:  CC:   Chief Complaint  Patient presents with  . Follow-up    3 month     HPI:   Alexandria Graves a 62 y.o. female who presents for followup on hypertension hyperlipidemia, IFG and obesity. She has been following a low glycemic index diet but has lost 14 pounds since January however her blood pressure has increased. She is walking on a regular basis for exercise. She has no specific complaints today.   Past Medical History  Diagnosis Date  . History of migraine headaches   . Hypertension   . Hyperlipidemia     Past Surgical History  Procedure Laterality Date  . Cholecystectomy  1984       The following portions of the patient's history were reviewed and updated as appropriate: Allergies, current medications, and problem list.    Review of Systems:   Patient denies headache, fevers, malaise, unintentional weight loss, skin rash, eye pain, sinus congestion and sinus pain, sore throat, dysphagia,  hemoptysis , cough, dyspnea, wheezing, chest pain, palpitations, orthopnea, edema, abdominal pain, nausea, melena, diarrhea, constipation, flank pain, dysuria, hematuria, urinary  Frequency, nocturia, numbness, tingling, seizures,  Focal weakness, Loss of consciousness,  Tremor, insomnia, depression, anxiety, and suicidal ideation.      History   Social History  . Marital Status: Married    Spouse Name: N/A    Number of Children: N/A  . Years of Education: N/A   Occupational History  . Not on file.   Social History Main  Topics  . Smoking status: Former Smoker    Quit date: 08/26/1971  . Smokeless tobacco: Never Used  . Alcohol Use: Yes  . Drug Use: No  . Sexual Activity: Yes   Other Topics Concern  . Not on file   Social History Narrative  . No narrative on file    Objective:  Filed Vitals:   01/19/13 0859  BP: 148/82  Pulse: 66  Temp: 98.1 F (36.7 C)  Resp: 14     General appearance: alert, cooperative and appears stated age Ears: normal TM's and external ear canals both ears Throat: lips, mucosa, and tongue normal; teeth and gums normal Neck: no adenopathy, no carotid bruit, supple, symmetrical, trachea midline and thyroid not enlarged, symmetric, no tenderness/mass/nodules Back: symmetric, no curvature. ROM normal. No CVA tenderness. Lungs: clear to auscultation bilaterally Heart: regular rate and rhythm, S1, S2 normal, no murmur, click, rub or gallop Abdomen: soft, non-tender; bowel sounds normal; no masses,  no organomegaly Pulses: 2+ and symmetric Skin: Skin color, texture, turgor normal. No rashes or lesions Lymph nodes: Cervical, supraclavicular, and axillary nodes normal. Foot exam:  Nails are well trimmed,  No callouses,  Sensation intact to microfilament   Assessment and Plan:  Obesity (BMI 30-39.9) Improving B<I.  Goal is 168 lbs  For BMI < 30  Hypertension Elevated today.  Reviewed list of meds, patient is not taking OTC meds that could be causing it. Given her history of impaired fasting glucose and her microscopic proteinuria I  will add lisinopril to her current hydrochlorothiazide dose.  Impaired fasting glucose  A1c remains in the normal range at 5.9 but she has concurrent  hypertension hyperlipidemia and obesity and would benefit from an annual eye exam. She had a foot exam today which was normal. Lipids are borderline  A total of 25 minutes was spent with patient more than half of which was spent in counseling, reviewing records from other prviders and  coordination of care.   Updated Medication List Outpatient Encounter Prescriptions as of 01/19/2013  Medication Sig Dispense Refill  . aspirin 81 MG tablet Take 81 mg by mouth daily.      . cetirizine (ZYRTEC) 10 MG tablet Take 10 mg by mouth daily.      . fish oil-omega-3 fatty acids 1000 MG capsule Take 1 g by mouth daily.      . furosemide (LASIX) 20 MG tablet As needed for fluid retention  30 tablet  3  . hydrochlorothiazide (HYDRODIURIL) 25 MG tablet Take 1 tablet (25 mg total) by mouth daily.  90 tablet  0  . potassium chloride SA (K-DUR,KLOR-CON) 20 MEQ tablet Take 1 tablet (20 mEq total) by mouth daily. As needed  For fluid retention  90 tablet  0  . propranolol ER (INDERAL LA) 120 MG 24 hr capsule Take 1 capsule (120 mg total) by mouth daily.  30 capsule  0  . lisinopril (PRINIVIL,ZESTRIL) 5 MG tablet Take 1 tablet (5 mg total) by mouth daily.  30 tablet  0  . [DISCONTINUED] benzonatate (TESSALON) 100 MG capsule Take 1 capsule (100 mg total) by mouth 3 (three) times daily as needed for cough.  60 capsule  0  . [DISCONTINUED] guaiFENesin-codeine (CHERATUSSIN AC) 100-10 MG/5ML syrup Take 5 mLs by mouth 4 (four) times daily as needed for cough.  200 mL  0  . [DISCONTINUED] levofloxacin (LEVAQUIN) 500 MG tablet Take 1 tablet (500 mg total) by mouth daily.  7 tablet  0  . [DISCONTINUED] OMEPRAZOLE PO Take 1 tablet by mouth daily.       No facility-administered encounter medications on file as of 01/19/2013.     Orders Placed This Encounter  Procedures  . Lipid panel  . Hemoglobin A1c  . Comprehensive metabolic panel  . Microalbumin / creatinine urine ratio  . Microalbumin / creatinine urine ratio  . Ambulatory referral to Vascular Surgery    No Follow-up on file.

## 2013-01-24 ENCOUNTER — Telehealth: Payer: Self-pay | Admitting: Internal Medicine

## 2013-01-24 ENCOUNTER — Encounter: Payer: Self-pay | Admitting: *Deleted

## 2013-01-24 DIAGNOSIS — R609 Edema, unspecified: Secondary | ICD-10-CM

## 2013-01-24 NOTE — Assessment & Plan Note (Signed)
Ultrasound eval by [facility] was normal, negative for DVTS and venous insufficiency

## 2013-01-24 NOTE — Telephone Encounter (Signed)
Letter mailed

## 2013-01-24 NOTE — Telephone Encounter (Signed)
Ultrasound eval by [facility] was normal, negative for DVTS and venous insufficiency in leg.  Can continue prn lasix for edema

## 2013-01-26 ENCOUNTER — Encounter: Payer: Self-pay | Admitting: Internal Medicine

## 2013-01-26 DIAGNOSIS — I10 Essential (primary) hypertension: Secondary | ICD-10-CM

## 2013-01-26 NOTE — Assessment & Plan Note (Signed)
Lisinopril recommended for proteinuria; patient declined 02/06/13

## 2013-02-09 ENCOUNTER — Encounter: Payer: Self-pay | Admitting: Internal Medicine

## 2013-02-09 NOTE — Telephone Encounter (Signed)
Mailed unread message to pt  

## 2013-03-23 ENCOUNTER — Other Ambulatory Visit: Payer: Self-pay | Admitting: Internal Medicine

## 2013-03-23 ENCOUNTER — Other Ambulatory Visit: Payer: Self-pay

## 2013-04-20 ENCOUNTER — Encounter: Payer: Self-pay | Admitting: Internal Medicine

## 2013-04-20 ENCOUNTER — Ambulatory Visit (INDEPENDENT_AMBULATORY_CARE_PROVIDER_SITE_OTHER): Payer: BC Managed Care – PPO | Admitting: Internal Medicine

## 2013-04-20 VITALS — BP 138/78 | HR 54 | Temp 97.8°F | Resp 12 | Ht 62.0 in | Wt 173.5 lb

## 2013-04-20 DIAGNOSIS — N390 Urinary tract infection, site not specified: Secondary | ICD-10-CM

## 2013-04-20 DIAGNOSIS — E669 Obesity, unspecified: Secondary | ICD-10-CM

## 2013-04-20 DIAGNOSIS — Z8619 Personal history of other infectious and parasitic diseases: Secondary | ICD-10-CM

## 2013-04-20 DIAGNOSIS — E785 Hyperlipidemia, unspecified: Secondary | ICD-10-CM

## 2013-04-20 DIAGNOSIS — K222 Esophageal obstruction: Secondary | ICD-10-CM

## 2013-04-20 DIAGNOSIS — R609 Edema, unspecified: Secondary | ICD-10-CM

## 2013-04-20 DIAGNOSIS — I10 Essential (primary) hypertension: Secondary | ICD-10-CM

## 2013-04-20 DIAGNOSIS — R7301 Impaired fasting glucose: Secondary | ICD-10-CM

## 2013-04-20 LAB — POCT URINALYSIS DIPSTICK
Ketones, UA: NEGATIVE
Protein, UA: NEGATIVE
Spec Grav, UA: 1.025
Urobilinogen, UA: 0.2
pH, UA: 6.5

## 2013-04-20 LAB — MICROALBUMIN / CREATININE URINE RATIO
Creatinine,U: 125 mg/dL
Microalb, Ur: 0.5 mg/dL (ref 0.0–1.9)

## 2013-04-20 MED ORDER — PROPRANOLOL HCL ER 60 MG PO CP24: 60.0000 mg | ORAL_CAPSULE | Freq: Every day | ORAL | Status: DC

## 2013-04-20 NOTE — Patient Instructions (Addendum)
Goal bp is 30/80 or less and pulse < 90  If you stop the propranolol and VS are consistently above these numbers,  Use my chart to contact me.   The urine test should be available tomorrow  Your ultrasound was normal   Your husband is welcome in my practice   Return in 3 months

## 2013-04-20 NOTE — Assessment & Plan Note (Addendum)
improving with dietary restrictions and 3 to 4 days of exercise  Per week.  Her goal wt is 163 lbs or less to lower BMI to < 30.  Encouragement given and diet reviewed.

## 2013-04-20 NOTE — Progress Notes (Signed)
Patient ID: Alexandria Graves, female   DOB: 1951/01/17, 62 y.o.   MRN: [id number]  Patient Active Problem List   Diagnosis Date Noted  . History of shingles 04/20/2013  . Edema 01/19/2013  . Impaired fasting glucose 01/19/2013  . Painless hematuria 06/13/2012  . Anxiety 03/11/2012  . Stricture and stenosis of esophagus 03/09/2012  . Diuretic-induced hypokalemia 08/27/2011  . History of migraine headaches   . Hypertension   . Hyperlipidemia   . Obesity (BMI 30-39.9) 08/26/2011  . Cough 08/26/2011    Subjective:  CC:   Chief Complaint  Patient presents with  . Follow-up    3 month     HPI:   Alexandria Graves a 62 y.o. female who presents for Followup on hypertension hyperlipidemia diet-controlled diabetes and obesity. She has been following a low glycemic index diet but has lost 14 pounds since January however her blood pressure has increased. She is walking on a regular basis for exercise. She has no specific complaints today.   Past Medical History  Diagnosis Date  . History of migraine headaches   . Hypertension   . Hyperlipidemia     Past Surgical History  Procedure Laterality Date  . Cholecystectomy  1984       The following portions of the patient's history were reviewed and updated as appropriate: Allergies, current medications, and problem list.    Review of Systems:   Patient denies headache, fevers, malaise, unintentional weight loss, skin rash, eye pain, sinus congestion and sinus pain, sore throat, dysphagia,  hemoptysis , cough, dyspnea, wheezing, chest pain, palpitations, orthopnea, edema, abdominal pain, nausea, melena, diarrhea, constipation, flank pain, dysuria, hematuria, urinary  Frequency, nocturia, numbness, tingling, seizures,  Focal weakness, Loss of consciousness,  Tremor, insomnia, depression, anxiety, and suicidal ideation.     History   Social History  . Marital Status: Married    Spouse Name: N/A    Number of Children: N/A  . Years of  Education: N/A   Occupational History  . Not on file.   Social History Main Topics  . Smoking status: Former Smoker    Quit date: 08/26/1971  . Smokeless tobacco: Never Used  . Alcohol Use: Yes  . Drug Use: No  . Sexual Activity: Yes   Other Topics Concern  . Not on file   Social History Narrative  . No narrative on file    Objective:  Filed Vitals:   04/20/13 0842  BP: 138/78  Pulse: 54  Temp: 97.8 F (36.6 C)  Resp: 12     General appearance: alert, cooperative and appears stated age Neck: no adenopathy, no carotid bruit, supple, symmetrical, trachea midline and thyroid not enlarged, symmetric, no tenderness/mass/nodules Back: symmetric, no curvature. ROM normal. No CVA tenderness. Lungs: clear to auscultation bilaterally Heart: regular rate and rhythm, S1, S2 normal, no murmur, click, rub or gallop Abdomen: soft, non-tender; bowel sounds normal; no masses,  no organomegaly Pulses: 2+ and symmetric Skin: Skin color, texture, turgor normal. No rashes or lesions Lymph nodes: Cervical, supraclavicular, and axillary nodes normal.  Assessment and Plan:  Obesity (BMI 30-39.9) improving with dietary restrictions and 3 to 4 days of exercise  Per week.  Her goal wt is 163 lbs or less to lower BMI to < 30.  Encouragement given and diet reviewed.   Stricture and stenosis of esophagus Secondary to schatzki's ring, dilated during Nov 2013 EGD.  No recurrence of dysphagia.  Continue PPI.   Impaired fasting glucose A1cs have been nondiagnostic of  DM.   Hypertension Well controlled on current regimen. Renal function stable.  She would like to stop the inderal ,  Due to excessive fatigue.  It was started because of concurrent migraine headaches. Discussed weaning it to off and adding hctz.    Lab Results  Component Value Date   CREATININE 0.7 01/19/2013     Hyperlipidemia Mild, managed with diet.  Lab Results  Component Value Date   CHOL 186 01/19/2013   HDL 46.80  01/19/2013   LDLCALC 123* 01/19/2013   TRIG 82.0 01/19/2013   CHOLHDL 4 01/19/2013     Edema Venous ultrasound of right legs was done at [facility]  And was not suggestive of VI or lymphedema.   Prn furosemide, salt restriction.    Updated Medication List Outpatient Encounter Prescriptions as of 04/20/2013  Medication Sig  . aspirin 81 MG tablet Take 81 mg by mouth daily.  . cetirizine (ZYRTEC) 10 MG tablet Take 10 mg by mouth daily.  . fish oil-omega-3 fatty acids 1000 MG capsule Take 1 g by mouth daily.  . furosemide (LASIX) 20 MG tablet AS NEEDED FOR FLUID        RETENTION  . hydrochlorothiazide (HYDRODIURIL) 25 MG tablet Take 1 tablet (25 mg total) by mouth daily.  . propranolol ER (INDERAL LA) 60 MG 24 hr capsule Take 1 capsule (60 mg total) by mouth daily.  . [DISCONTINUED] propranolol ER (INDERAL LA) 120 MG 24 hr capsule Take 1 capsule (120 mg total) by mouth daily.     Orders Placed This Encounter  Procedures  . Microalbumin / creatinine urine ratio  . POCT urinalysis dipstick    Return in about 3 months (around 07/19/2013).

## 2013-04-21 ENCOUNTER — Encounter: Payer: Self-pay | Admitting: Internal Medicine

## 2013-04-22 ENCOUNTER — Encounter: Payer: Self-pay | Admitting: Internal Medicine

## 2013-04-22 NOTE — Assessment & Plan Note (Signed)
A1cs have been nondiagnostic of DM.

## 2013-04-22 NOTE — Assessment & Plan Note (Signed)
Venous ultrasound of right legs was done at [facility]  And was not suggestive of VI or lymphedema.   Prn furosemide, salt restriction.

## 2013-04-22 NOTE — Assessment & Plan Note (Signed)
Secondary to schatzki's ring, dilated during Nov 2013 EGD.  No recurrence of dysphagia.  Continue PPI.

## 2013-04-22 NOTE — Assessment & Plan Note (Signed)
Mild, managed with diet.  Lab Results  Component Value Date   CHOL 186 01/19/2013   HDL 46.80 01/19/2013   LDLCALC 123* 01/19/2013   TRIG 82.0 01/19/2013   CHOLHDL 4 01/19/2013      

## 2013-04-22 NOTE — Assessment & Plan Note (Addendum)
Well controlled on current regimen. Renal function stable.  She would like to stop the inderal ,  Due to excessive fatigue.  It was started because of concurrent migraine headaches. Discussed weaning it to off and adding hctz.    Lab Results  Component Value Date   CREATININE 0.7 01/19/2013

## 2013-05-25 MED ORDER — PROPRANOLOL HCL ER 120 MG PO CP24: 120.0000 mg | ORAL_CAPSULE | Freq: Every day | ORAL | Status: DC

## 2013-05-25 NOTE — Addendum Note (Signed)
Addended by: Crecencio Mc on: 05/25/2013 01:24 PM   Modules accepted: Orders

## 2013-07-20 ENCOUNTER — Encounter: Payer: Self-pay | Admitting: Internal Medicine

## 2013-07-20 ENCOUNTER — Ambulatory Visit (INDEPENDENT_AMBULATORY_CARE_PROVIDER_SITE_OTHER): Payer: BC Managed Care – PPO | Admitting: Internal Medicine

## 2013-07-20 VITALS — BP 134/80 | HR 58 | Temp 98.1°F | Resp 16 | Wt 170.0 lb

## 2013-07-20 DIAGNOSIS — E669 Obesity, unspecified: Secondary | ICD-10-CM

## 2013-07-20 DIAGNOSIS — R7301 Impaired fasting glucose: Secondary | ICD-10-CM

## 2013-07-20 DIAGNOSIS — R739 Hyperglycemia, unspecified: Secondary | ICD-10-CM

## 2013-07-20 DIAGNOSIS — E876 Hypokalemia: Secondary | ICD-10-CM

## 2013-07-20 DIAGNOSIS — I10 Essential (primary) hypertension: Secondary | ICD-10-CM

## 2013-07-20 DIAGNOSIS — K222 Esophageal obstruction: Secondary | ICD-10-CM

## 2013-07-20 DIAGNOSIS — R7309 Other abnormal glucose: Secondary | ICD-10-CM

## 2013-07-20 DIAGNOSIS — E785 Hyperlipidemia, unspecified: Secondary | ICD-10-CM

## 2013-07-20 DIAGNOSIS — T502X5A Adverse effect of carbonic-anhydrase inhibitors, benzothiadiazides and other diuretics, initial encounter: Secondary | ICD-10-CM

## 2013-07-20 LAB — BASIC METABOLIC PANEL
BUN: 16 mg/dL (ref 6–23)
CHLORIDE: 103 meq/L (ref 96–112)
CO2: 27 mEq/L (ref 19–32)
Calcium: 8.7 mg/dL (ref 8.4–10.5)
Creatinine, Ser: 0.8 mg/dL (ref 0.4–1.2)
GFR: 81.02 mL/min (ref >60.00)
GLUCOSE: 87 mg/dL (ref 70–99)
POTASSIUM: 3.5 meq/L (ref 3.5–5.1)
SODIUM: 138 meq/L (ref 135–145)

## 2013-07-20 LAB — HEMOGLOBIN A1C: Hgb A1c MFr Bld: 5.8 % (ref 4.6–6.5)

## 2013-07-20 MED ORDER — HYDROCHLOROTHIAZIDE 25 MG PO TABS: 25.0000 mg | ORAL_TABLET | Freq: Every day | ORAL | Status: DC

## 2013-07-20 NOTE — Assessment & Plan Note (Signed)
Mild, managed with diet.  Lab Results  Component Value Date   CHOL 186 01/19/2013   HDL 46.80 01/19/2013   LDLCALC 123* 01/19/2013   TRIG 82.0 01/19/2013   CHOLHDL 4 01/19/2013

## 2013-07-20 NOTE — Progress Notes (Signed)
Pre-visit discussion using our clinic review tool. No additional management support is needed unless otherwise documented below in the visit note.  

## 2013-07-20 NOTE — Assessment & Plan Note (Signed)
She has lost 22 lbs over the last year.  I have congratulated her in reduction of   BMI and encouraged  Continued weight loss with goal of 10% of body weight over the next 6 months using a low glycemic index diet and regular exercise a minimum of 5 days per week.

## 2013-07-20 NOTE — Patient Instructions (Signed)
You are doing great!  You've lost 22 lbs over the past year   The book we discussed is called  New Rules of WeightLifting for Women "Lift like a man,Look like a goddess"   I Highly recommend using a Physiological scientist.

## 2013-07-20 NOTE — Assessment & Plan Note (Addendum)
Well controlled on current regimen. Renal function stable, no changes today.  Lab Results  Component Value Date   CREATININE 0.8 07/20/2013   Lab Results  Component Value Date   NA 138 07/20/2013   K 3.5 07/20/2013   CL 103 07/20/2013   CO2 27 07/20/2013

## 2013-07-20 NOTE — Assessment & Plan Note (Addendum)
A1cs remain well below the range diagnostic of DM. Continue exercise, wt loss and low glycemic index diet

## 2013-07-21 ENCOUNTER — Telehealth: Payer: Self-pay | Admitting: Internal Medicine

## 2013-07-21 NOTE — Telephone Encounter (Signed)
Relevant patient education assigned to patient using Emmi. ° °

## 2013-07-22 NOTE — Progress Notes (Signed)
Patient ID: Unique Sillas, female   DOB: 1950/06/24, 63 y.o.   MRN: [id number]   Patient Active Problem List   Diagnosis Date Noted  . History of shingles 04/20/2013  . Edema 01/19/2013  . Impaired fasting glucose 01/19/2013  . Painless hematuria 06/13/2012  . Anxiety 03/11/2012  . Stricture and stenosis of esophagus 03/09/2012  . Diuretic-induced hypokalemia 08/27/2011  . History of migraine headaches   . Hypertension   . Hyperlipidemia   . Obesity (BMI 30-39.9) 08/26/2011  . Cough 08/26/2011    Subjective:  CC:   Chief Complaint  Patient presents with  . Follow-up  . Hypertension  . Weight Check    HPI:   Alexandria Graves is a 63 y.o. female who presents for Followup on hypertension hyperlipidemia diet-controlled diabetes and obesity. She has been following a low glycemic index diet and has lost 22 lbs over the past year. . She is walking on a regular basis for exercise. She has no specific complaints today.   Past Medical History  Diagnosis Date  . History of migraine headaches   . Hypertension   . Hyperlipidemia     Past Surgical History  Procedure Laterality Date  . Cholecystectomy  1984       The following portions of the patient's history were reviewed and updated as appropriate: Allergies, current medications, and problem list.    Review of Systems:   Patient denies headache, fevers, malaise, unintentional weight loss, skin rash, eye pain, sinus congestion and sinus pain, sore throat, dysphagia,  hemoptysis , cough, dyspnea, wheezing, chest pain, palpitations, orthopnea, edema, abdominal pain, nausea, melena, diarrhea, constipation, flank pain, dysuria, hematuria, urinary  Frequency, nocturia, numbness, tingling, seizures,  Focal weakness, Loss of consciousness,  Tremor, insomnia, depression, anxiety, and suicidal ideation.     History   Social History  . Marital Status: Married    Spouse Name: N/A    Number of Children: N/A  . Years of Education: N/A    Occupational History  . Not on file.   Social History Main Topics  . Smoking status: Former Smoker    Quit date: 08/26/1971  . Smokeless tobacco: Never Used  . Alcohol Use: Yes  . Drug Use: No  . Sexual Activity: Yes   Other Topics Concern  . Not on file   Social History Narrative  . No narrative on file    Objective:  Filed Vitals:   07/20/13 0822  BP: 134/80  Pulse: 58  Temp: 98.1 F (36.7 C)  Resp: 16     General appearance: alert, cooperative and appears stated age Ears: normal TM's and external ear canals both ears Throat: lips, mucosa, and tongue normal; teeth and gums normal Neck: no adenopathy, no carotid bruit, supple, symmetrical, trachea midline and thyroid not enlarged, symmetric, no tenderness/mass/nodules Back: symmetric, no curvature. ROM normal. No CVA tenderness. Lungs: clear to auscultation bilaterally Heart: regular rate and rhythm, S1, S2 normal, no murmur, click, rub or gallop Abdomen: soft, non-tender; bowel sounds normal; no masses,  no organomegaly Pulses: 2+ and symmetric Skin: Skin color, texture, turgor normal. No rashes or lesions Lymph nodes: Cervical, supraclavicular, and axillary nodes normal.  Assessment and Plan:  Impaired fasting glucose A1cs remain well below the range diagnostic of DM. Continue exercise, wt loss and low glycemic index diet     Obesity (BMI 30-39.9) She has lost 22 lbs over the last year.  I have congratulated her in reduction of   BMI and encouraged  Continued weight  loss with goal of 10% of body weight over the next 6 months using a low glycemic index diet and regular exercise a minimum of 5 days per week.    Hypertension Well controlled on current regimen. Renal function stable, no changes today.  Lab Results  Component Value Date   CREATININE 0.8 07/20/2013   Lab Results  Component Value Date   NA 138 07/20/2013   K 3.5 07/20/2013   CL 103 07/20/2013   CO2 27 07/20/2013     Hyperlipidemia Mild,  managed with diet.  Lab Results  Component Value Date   CHOL 186 01/19/2013   HDL 46.80 01/19/2013   LDLCALC 123* 01/19/2013   TRIG 82.0 01/19/2013   CHOLHDL 4 01/19/2013       Stricture and stenosis of esophagus Currently asymptomatic.  Continue daily PPI  Diuretic-induced hypokalemia Resolved,  But likely to drop with use of prn furosemide. Supplemental potassium  Lab Results  Component Value Date   NA 138 07/20/2013   K 3.5 07/20/2013   CL 103 07/20/2013   CO2 27 07/20/2013        Updated Medication List Outpatient Encounter Prescriptions as of 07/20/2013  Medication Sig  . aspirin 81 MG tablet Take 81 mg by mouth daily.  . cetirizine (ZYRTEC) 10 MG tablet Take 10 mg by mouth daily.  . fish oil-omega-3 fatty acids 1000 MG capsule Take 1 g by mouth daily.  . furosemide (LASIX) 20 MG tablet AS NEEDED FOR FLUID        RETENTION  . hydrochlorothiazide (HYDRODIURIL) 25 MG tablet Take 1 tablet (25 mg total) by mouth daily.  . [DISCONTINUED] hydrochlorothiazide (HYDRODIURIL) 25 MG tablet Take 1 tablet (25 mg total) by mouth daily.  . propranolol ER (INDERAL LA) 120 MG 24 hr capsule Take 1 capsule (120 mg total) by mouth daily.     Orders Placed This Encounter  Procedures  . Hemoglobin A1c  . Basic Metabolic Panel (BMET)    No Follow-up on file.

## 2013-07-22 NOTE — Assessment & Plan Note (Signed)
Resolved,  But likely to drop with use of prn furosemide. Supplemental potassium  Lab Results  Component Value Date   NA 138 07/20/2013   K 3.5 07/20/2013   CL 103 07/20/2013   CO2 27 07/20/2013

## 2013-07-22 NOTE — Assessment & Plan Note (Signed)
Currently asymptomatic.  Continue daily PPI

## 2013-07-24 NOTE — Telephone Encounter (Signed)
Unread mychart message mailed to patient 

## 2013-08-08 ENCOUNTER — Other Ambulatory Visit: Payer: Self-pay | Admitting: Internal Medicine

## 2013-10-16 ENCOUNTER — Other Ambulatory Visit: Payer: Self-pay | Admitting: Internal Medicine

## 2013-12-22 ENCOUNTER — Other Ambulatory Visit: Payer: Self-pay | Admitting: Internal Medicine

## 2013-12-25 LAB — HM COLONOSCOPY

## 2014-01-25 ENCOUNTER — Encounter: Payer: Self-pay | Admitting: Internal Medicine

## 2014-01-25 ENCOUNTER — Ambulatory Visit (INDEPENDENT_AMBULATORY_CARE_PROVIDER_SITE_OTHER): Payer: BC Managed Care – PPO | Admitting: Internal Medicine

## 2014-01-25 VITALS — BP 112/74 | HR 50 | Temp 97.9°F | Resp 14 | Ht 62.0 in | Wt 166.8 lb

## 2014-01-25 DIAGNOSIS — R7301 Impaired fasting glucose: Secondary | ICD-10-CM

## 2014-01-25 DIAGNOSIS — I10 Essential (primary) hypertension: Secondary | ICD-10-CM

## 2014-01-25 DIAGNOSIS — R5383 Other fatigue: Secondary | ICD-10-CM

## 2014-01-25 DIAGNOSIS — E669 Obesity, unspecified: Secondary | ICD-10-CM

## 2014-01-25 DIAGNOSIS — E785 Hyperlipidemia, unspecified: Secondary | ICD-10-CM

## 2014-01-25 DIAGNOSIS — R5381 Other malaise: Secondary | ICD-10-CM

## 2014-01-25 DIAGNOSIS — Z79899 Other long term (current) drug therapy: Secondary | ICD-10-CM

## 2014-01-25 LAB — COMPREHENSIVE METABOLIC PANEL
ALBUMIN: 4.1 g/dL (ref 3.5–5.2)
ALT: 19 U/L (ref 0–35)
AST: 23 U/L (ref 0–37)
Alkaline Phosphatase: 46 U/L (ref 39–117)
BUN: 21 mg/dL (ref 6–23)
CALCIUM: 9.8 mg/dL (ref 8.4–10.5)
CHLORIDE: 99 meq/L (ref 96–112)
CO2: 29 mEq/L (ref 19–32)
Creatinine, Ser: 0.9 mg/dL (ref 0.4–1.2)
GFR: 71.2 mL/min (ref >60.00)
GLUCOSE: 93 mg/dL (ref 70–99)
POTASSIUM: 3.6 meq/L (ref 3.5–5.1)
SODIUM: 138 meq/L (ref 135–145)
TOTAL PROTEIN: 8.2 g/dL (ref 6.0–8.3)
Total Bilirubin: 0.5 mg/dL (ref 0.2–1.2)

## 2014-01-25 LAB — LIPID PANEL
CHOLESTEROL: 221 mg/dL — AB (ref 0–200)
HDL: 51.8 mg/dL (ref >39.00)
LDL CALC: 148 mg/dL — AB (ref 0–99)
NonHDL: 169.2
TRIGLYCERIDES: 105 mg/dL (ref 0.0–149.0)
Total CHOL/HDL Ratio: 4
VLDL: 21 mg/dL (ref 0.0–40.0)

## 2014-01-25 LAB — CBC WITH DIFFERENTIAL/PLATELET
BASOS PCT: 0.9 % (ref 0.0–3.0)
Basophils Absolute: 0.1 10*3/uL (ref 0.0–0.1)
EOS PCT: 5.2 % — AB (ref 0.0–5.0)
Eosinophils Absolute: 0.4 10*3/uL (ref 0.0–0.7)
HCT: 43.8 % (ref 36.0–46.0)
HEMOGLOBIN: 14.9 g/dL (ref 12.0–15.0)
LYMPHS PCT: 32.5 % (ref 12.0–46.0)
Lymphs Abs: 2.2 10*3/uL (ref 0.7–4.0)
MCHC: 33.9 g/dL (ref 30.0–36.0)
MCV: 83.2 fl (ref 78.0–100.0)
Monocytes Absolute: 0.4 10*3/uL (ref 0.1–1.0)
Monocytes Relative: 6.1 % (ref 3.0–12.0)
Neutro Abs: 3.8 10*3/uL (ref 1.4–7.7)
Neutrophils Relative %: 55.3 % (ref 43.0–77.0)
Platelets: 298 10*3/uL (ref 150.0–400.0)
RBC: 5.26 Mil/uL — AB (ref 3.87–5.11)
RDW: 13 % (ref 11.5–15.5)
WBC: 6.8 10*3/uL (ref 4.0–10.5)

## 2014-01-25 MED ORDER — PROPRANOLOL HCL ER 80 MG PO CP24: 80.0000 mg | ORAL_CAPSULE | Freq: Every day | ORAL | Status: DC

## 2014-01-25 NOTE — Progress Notes (Signed)
Pre visit review using our clinic review tool, if applicable. No additional management support is needed unless otherwise documented below in the visit note. 

## 2014-01-25 NOTE — Patient Instructions (Signed)
We are reducing your inderal LA to 80 mg daily  You can also try metoprolol XL 50 mg daily at bedtime  We will call you with your labs results

## 2014-01-27 ENCOUNTER — Encounter: Payer: Self-pay | Admitting: Internal Medicine

## 2014-01-27 NOTE — Assessment & Plan Note (Addendum)
Well controlled on current regimen, but the inderal LA shtat she takes for headache prevention is causing bradycardia and faituge.  Reducind dose today as a trial . Renal function stable,   Lab Results  Component Value Date   CREATININE 0.9 01/25/2014   Lab Results  Component Value Date   NA 138 01/25/2014   K 3.6 01/25/2014   CL 99 01/25/2014   CO2 29 01/25/2014

## 2014-01-27 NOTE — Progress Notes (Signed)
Patient ID: Alexandria Graves, female   DOB: [birth year], 63 y.o.   MRN: [id number]   Patient Active Problem List   Diagnosis Date Noted  . History of shingles 04/20/2013  . Edema 01/19/2013  . Impaired fasting glucose 01/19/2013  . Painless hematuria 06/13/2012  . Anxiety 03/11/2012  . Stricture and stenosis of esophagus 03/09/2012  . Diuretic-induced hypokalemia 08/27/2011  . History of migraine headaches   . Hypertension   . Hyperlipidemia   . Obesity (BMI 30-39.9) 08/26/2011  . Cough 08/26/2011    Subjective:  CC:   Chief Complaint  Patient presents with  . Follow-up    6 month follow up    HPI:   Alexandria Graves is a 63 y.o. female who presents for 6 month follow up on hypertension , hyperlipidemia, impaired fasting glucose and obesity.  She feels generally well,  Has bee tolerating her medications and taking them as directed. Exercising 4 days per week and working hard to lose weight with a sensible low glycemic index diet. .     Past Medical History  Diagnosis Date  . History of migraine headaches   . Hypertension   . Hyperlipidemia     Past Surgical History  Procedure Laterality Date  . Cholecystectomy  1984       The following portions of the patient's history were reviewed and updated as appropriate: Allergies, current medications, and problem list.    Review of Systems:   Patient denies headache, fevers, malaise, unintentional weight loss, skin rash, eye pain, sinus congestion and sinus pain, sore throat, dysphagia,  hemoptysis , cough, dyspnea, wheezing, chest pain, palpitations, orthopnea, edema, abdominal pain, nausea, melena, diarrhea, constipation, flank pain, dysuria, hematuria, urinary  Frequency, nocturia, numbness, tingling, seizures,  Focal weakness, Loss of consciousness,  Tremor, insomnia, depression, anxiety, and suicidal ideation.     History   Social History  . Marital Status: Married    Spouse Name: N/A    Number of Children: N/A  .  Years of Education: N/A   Occupational History  . Not on file.   Social History Main Topics  . Smoking status: Former Smoker    Quit date: 08/26/1971  . Smokeless tobacco: Never Used  . Alcohol Use: Yes  . Drug Use: No  . Sexual Activity: Yes   Other Topics Concern  . Not on file   Social History Narrative  . No narrative on file    Objective:  Filed Vitals:   01/25/14 0757  BP: 112/74  Pulse: 50  Temp: 97.9 F (36.6 C)  Resp: 14     General appearance: alert, cooperative and appears stated age Ears: normal TM's and external ear canals both ears Throat: lips, mucosa, and tongue normal; teeth and gums normal Neck: no adenopathy, no carotid bruit, supple, symmetrical, trachea midline and thyroid not enlarged, symmetric, no tenderness/mass/nodules Back: symmetric, no curvature. ROM normal. No CVA tenderness. Lungs: clear to auscultation bilaterally Heart: regular rate and rhythm, S1, S2 normal, no murmur, click, rub or gallop Abdomen: soft, non-tender; bowel sounds normal; no masses,  no organomegaly Pulses: 2+ and symmetric Skin: Skin color, texture, turgor normal. No rashes or lesions Lymph nodes: Cervical, supraclavicular, and axillary nodes normal.  Assessment and Plan:  Hypertension Well controlled on current regimen, but the inderal LA shtat she takes for headache prevention is causing bradycardia and faituge.  Reducind dose today as a trial . Renal function stable,   Lab Results  Component Value Date   CREATININE  0.9 01/25/2014   Lab Results  Component Value Date   NA 138 01/25/2014   K 3.6 01/25/2014   CL 99 01/25/2014   CO2 29 01/25/2014     Impaired fasting glucose Her A1cs was well below the range diagnostic of DM. Continue exercise, wt loss and low glycemic index diet  Lab Results  Component Value Date   HGBA1C 5.8 07/20/2013         Obesity (BMI 30-39.9) I have congratulated her in reduction of   BMI and encouraged  Continued weight loss  with goal of 10% of body weigh over the next 6 months using a low glycemic index diet and regular exercise a minimum of 5 days per week.    Hyperlipidemia Mild, managed with diet.  Lab Results  Component Value Date   CHOL 221* 01/25/2014   HDL 51.80 01/25/2014   LDLCALC 148* 01/25/2014   TRIG 105.0 01/25/2014   CHOLHDL 4 01/25/2014          Updated Medication List Outpatient Encounter Prescriptions as of 01/25/2014  Medication Sig  . aspirin 81 MG tablet Take 81 mg by mouth daily.  . Biotin 1000 MCG tablet Take 1,000 mcg by mouth 3 (three) times daily.  . cetirizine (ZYRTEC) 10 MG tablet Take 10 mg by mouth daily.  . fish oil-omega-3 fatty acids 1000 MG capsule Take 1 g by mouth daily.  . furosemide (LASIX) 20 MG tablet TAKE AS NEEDED FOR FLUID   RETENTION  . hydrochlorothiazide (HYDRODIURIL) 25 MG tablet TAKE 1 TABLET DAILY  . propranolol ER (INDERAL LA) 80 MG 24 hr capsule Take 1 capsule (80 mg total) by mouth daily.  . vitamin C (ASCORBIC ACID) 500 MG tablet Take 500 mg by mouth daily.  . [DISCONTINUED] furosemide (LASIX) 20 MG tablet AS NEEDED FOR FLUID        RETENTION  . [DISCONTINUED] propranolol ER (INDERAL LA) 120 MG 24 hr capsule Take 1 capsule (120 mg total) by mouth daily.  . [DISCONTINUED] propranolol ER (INDERAL LA) 120 MG 24 hr capsule TAKE 1 CAPSULE DAILY  . [DISCONTINUED] propranolol ER (INDERAL LA) 80 MG 24 hr capsule Take 1 capsule (80 mg total) by mouth daily.     Orders Placed This Encounter  Procedures  . CBC with Differential  . T4 AND TSH  . Lipid panel  . Comp Met (CMET)  . HM COLONOSCOPY    No Follow-up on file.

## 2014-01-27 NOTE — Assessment & Plan Note (Addendum)
Mild, managed with diet, with increase noted.  Advised to try red yeast rice and repeat lipids in 3 months.  Lab Results  Component Value Date   CHOL 221* 01/25/2014   HDL 51.80 01/25/2014   LDLCALC 148* 01/25/2014   TRIG 105.0 01/25/2014   CHOLHDL 4 01/25/2014

## 2014-01-27 NOTE — Assessment & Plan Note (Signed)
I have congratulated her in reduction of   BMI and encouraged  Continued weight loss with goal of 10% of body weigh over the next 6 months using a low glycemic index diet and regular exercise a minimum of 5 days per week.    

## 2014-01-27 NOTE — Assessment & Plan Note (Signed)
Her A1cs was well below the range diagnostic of DM. Continue exercise, wt loss and low glycemic index diet  Lab Results  Component Value Date   HGBA1C 5.8 07/20/2013

## 2014-01-27 NOTE — Addendum Note (Signed)
Addended by: Crecencio Mc on: 01/27/2014 08:40 PM   Modules accepted: Orders

## 2014-01-29 LAB — T4 AND TSH
T4, Total: 10.4 ug/dL (ref 4.5–12.0)
TSH: 1.32 u[IU]/mL (ref 0.450–4.500)

## 2014-01-31 NOTE — Telephone Encounter (Signed)
Mailed unread message to pt  

## 2014-02-06 MED ORDER — PROPRANOLOL HCL ER 80 MG PO CP24: 80.0000 mg | ORAL_CAPSULE | Freq: Every day | ORAL | Status: DC

## 2014-02-06 NOTE — Addendum Note (Signed)
Addended by: Wynonia Lawman E on: 02/06/2014 07:58 AM   Modules accepted: Orders

## 2014-07-04 ENCOUNTER — Other Ambulatory Visit: Payer: Self-pay | Admitting: Internal Medicine

## 2014-07-26 ENCOUNTER — Encounter: Payer: Self-pay | Admitting: Internal Medicine

## 2014-07-26 ENCOUNTER — Ambulatory Visit (INDEPENDENT_AMBULATORY_CARE_PROVIDER_SITE_OTHER): Payer: BLUE CROSS/BLUE SHIELD | Admitting: Internal Medicine

## 2014-07-26 VITALS — BP 128/72 | HR 52 | Temp 98.0°F | Resp 16 | Ht 61.5 in | Wt 172.8 lb

## 2014-07-26 DIAGNOSIS — R7301 Impaired fasting glucose: Secondary | ICD-10-CM | POA: Diagnosis not present

## 2014-07-26 DIAGNOSIS — Z1239 Encounter for other screening for malignant neoplasm of breast: Secondary | ICD-10-CM

## 2014-07-26 DIAGNOSIS — E785 Hyperlipidemia, unspecified: Secondary | ICD-10-CM

## 2014-07-26 DIAGNOSIS — I10 Essential (primary) hypertension: Secondary | ICD-10-CM

## 2014-07-26 DIAGNOSIS — E669 Obesity, unspecified: Secondary | ICD-10-CM

## 2014-07-26 DIAGNOSIS — K222 Esophageal obstruction: Secondary | ICD-10-CM

## 2014-07-26 LAB — COMPREHENSIVE METABOLIC PANEL
ALBUMIN: 4.3 g/dL (ref 3.5–5.2)
ALK PHOS: 48 U/L (ref 39–117)
ALT: 20 U/L (ref 0–35)
AST: 22 U/L (ref 0–37)
BILIRUBIN TOTAL: 0.7 mg/dL (ref 0.2–1.2)
BUN: 10 mg/dL (ref 6–23)
CALCIUM: 10 mg/dL (ref 8.4–10.5)
CO2: 35 meq/L — AB (ref 19–32)
CREATININE: 0.88 mg/dL (ref 0.40–1.20)
Chloride: 99 mEq/L (ref 96–112)
GFR: 69.22 mL/min (ref >60.00)
Glucose, Bld: 103 mg/dL — ABNORMAL HIGH (ref 70–99)
POTASSIUM: 4.1 meq/L (ref 3.5–5.1)
SODIUM: 138 meq/L (ref 135–145)
TOTAL PROTEIN: 7.9 g/dL (ref 6.0–8.3)

## 2014-07-26 LAB — LIPID PANEL
Cholesterol: 173 mg/dL (ref 0–200)
HDL: 51.2 mg/dL (ref >39.00)
LDL Cholesterol: 100 mg/dL — ABNORMAL HIGH (ref 0–99)
NONHDL: 121.8
Total CHOL/HDL Ratio: 3
Triglycerides: 109 mg/dL (ref 0.0–149.0)
VLDL: 21.8 mg/dL (ref 0.0–40.0)

## 2014-07-26 LAB — HEMOGLOBIN A1C: HEMOGLOBIN A1C: 5.7 % (ref 4.6–6.5)

## 2014-07-26 MED ORDER — PROPRANOLOL HCL ER 80 MG PO CP24: 80.0000 mg | ORAL_CAPSULE | Freq: Every day | ORAL | Status: DC

## 2014-07-26 MED ORDER — FUROSEMIDE 20 MG PO TABS: ORAL_TABLET | ORAL | Status: DC

## 2014-07-26 MED ORDER — HYDROCHLOROTHIAZIDE 25 MG PO TABS: 25.0000 mg | ORAL_TABLET | Freq: Every day | ORAL | Status: DC

## 2014-07-26 MED ORDER — POTASSIUM CHLORIDE CRYS ER 20 MEQ PO TBCR: 20.0000 meq | EXTENDED_RELEASE_TABLET | Freq: Every day | ORAL | Status: DC | PRN

## 2014-07-26 NOTE — Patient Instructions (Signed)
You can take 20 mg famotidine or 150 ranitidine once or twice daily for:  Additional histamine blockade Reflux   I have added a few more "finds"  t my " Low GI"  Weight loss Diet.  It is appropriate for all patients with normal renal function , gluten tolerance, and advised for patients who have prediabetes or diabetes:   All of the foods can be found at grocery stores and in bulk at [employer].  The Atkins protein bars and shakes are available in more varieties at [employer], [employer] and [place].     7 AM Breakfast:  Choose from the following:  < 5 carbs  Weekdays: Low carbohydrate Protein  Shakes (EAS AdvantEdge "Carb Control" shakes, Atkins,  Muscle Milk or Premier Protein shakes)     Weekends:  a scrambled egg/bacon/cheese burrito made with Mission's "carb  balance" whole wheat tortilla  (about 10 net carbs )  Eggs,  bacon /sausage , Joseph's pita /lavash bread or  (5 carbs)  A slice of fritatta ( egg based baked dish, no  crust:  google it) (< 10 carbs)   Avoid cereal and bananas, oatmeal and cream of wheat and grits. They are loaded with carbohydrates!   10 AM: high protein snack  (< 5 carbs)   Protein bar by Atkins  Or KIND  (the snack size, < 200 cal, usually < 6 carbs    A stick of cheese:  Around 1 carb,  100 cal      Other so called "protein bars" tend to be loaded with carbohydrates.  Remember, in food advertising, the word "energy" is synonymous for " carbohydrate."  Lunch:   A Sandwich using the bread choices listed, Can use any  Eggs,  lunchmeat, grilled meat or canned tuna).  Can add avocado, regular mayo/mustard  and cheese.  A Salad using blue cheese, ranch,  Goddess dressing  or vinagrette,  No croutons or "confetti" and no "candied nuts" but regular nuts OK.   2 HARD BOILED EGG WHITES AND A CUP OF one of these greek yogurts:    dannon lt n fit greek yogurt         chobani 100 greek yogurt,    Oikos triple zero greek yogurt       No pretzels or chips.  Pickles and  miniature sweet peppers are a good low  carb alternative that provide a "crunch"  The bread is the only source of carbohydrate in a sandwich and  can be decreased by trying some of these alternatives to traditional loaf bread:   Joseph's pita bread and Lavash (flat) bread :  50 cal and 4 net carbs  available at [employer]Js and [employer].  Taste better when toasted, use as pita chips  [employer] makes a variety of  flatbreads and  A PITA POCKET.    LOOK FOR  THE ONES THAT ARE 17 NET CARBS OR LESS    [employer] makes 2 sizes of  Low carb whole wheat tortillas  (The large one is  210 cal and 6 net carbs)   Avoid "Low fat dressings, as well as Barry Brunner and [place] dressings    3 PM/ Mid day  Snack:  Consider  1 ounce of  almonds, walnuts, pistachios, pecans, peanuts,  Macadamia nuts or a nut medley that does not contain raisins or cranberries.  No "granola"; the dried cranberries and raisins are loaded with carbohydrates. Mixed nuts as long as there are no raisins,  cranberries  or dried fruit.    Try the prosciutto/mozzarella cheese sticks by [employer]  In deli /backery section   High protein   To avoid overindulging in snacks: Try drinking a glass of unsweeted almond/coconut milk  Or a cup of coffee with your Atkins chocolate bar to keep you from having 3!!!   Pork rinds!  Yes Pork Rinds are low carb potato chip substitute!   Toasted Joseph's flatbread with hummous dip (chickpeas)       6 PM  Dinner:     Meat/fowl/fish with a green salad, and either broccoli, cauliflower, green beans, spinach, brussel sprouts, bok choy or  Lima beans. Fried in canola oil /olive oil BUT DO NOT BREAD THE PROTEIN!!      There is a low carb pasta by Dream[employer]'s that is acceptable and tastes great: only 5 digestible carbs/serving.( All grocery stores but BJs carry it )  Prepared Meals:  Try Hurley Cisco Angelo's chicken piccata or chicken or eggplant parm over low carb pasta.(Lowes and BJs)   Marjory Lies Sanchez's "Carnitas" (pulled  pork, no sauce,  0 carbs) or his beef pot roast to make a dinner burrito (at [employer])  Barbecue with cole slaw is low carb BUT NO BUN!  SAME WITH HAMBURGERS     Whole wheat pasta is still full of digestible carbs and  Not as low in glycemic index as Dreamfield's.   Brown rice is still rice,  So skip the rice and noodles if you eat [place] or [place] (or at least limit to 1/2 cup)  9 PM snack :   Breyer's "low carb" fudgsicle or  ice cream bar (Carb Smart line), or  Weight  Watcher's ice cream bar , or another "no sugar added" ice cream;  a serving of fresh berries/cherries with whipped cream   Cheese or greek yogurt   8 ounces of [place] unsweetened almond/cococunut milk  Cheese and crackers (using WASA crackers,  They are low carb) or peanut butter on low carb crackers or pita bread     Avoid bananas, pineapple, grapes  and watermelon on a regular basis because they are high in sugar.  THINK OF THEM AS DESSERT and do not have daily   Remember that snack Substitutions should be less than 10 NET carbs per serving and meals should be < 20 net carbs. Remember that carbohydrates from fiber do not affect blood sugar, so you can  subtract fiber grams to get the "net carbs " of any particular food item.

## 2014-07-28 ENCOUNTER — Encounter: Payer: Self-pay | Admitting: Internal Medicine

## 2014-07-28 NOTE — Assessment & Plan Note (Signed)
Secondary to GERD.   Recommended trial of H2 blocker qd or bid for occasional reflux symptoms .

## 2014-07-28 NOTE — Progress Notes (Signed)
Patient ID: Alexandria Graves, female   DOB: [birth year], 64 y.o.   MRN: [id number]    Patient Active Problem List   Diagnosis Date Noted  . History of shingles 04/20/2013  . Edema 01/19/2013  . Impaired fasting glucose 01/19/2013  . Painless hematuria 06/13/2012  . Anxiety 03/11/2012  . Stricture and stenosis of esophagus 03/09/2012  . History of migraine headaches   . Hypertension   . Hyperlipidemia   . Obesity (BMI 30-39.9) 08/26/2011    Subjective:  CC:   Chief Complaint  Patient presents with  . Follow-up    6 month    HPI:   Alexandria Graves is a 64 y.o. female who presents for 6 month follow up on obesity,  Pre diabetes,  Hypertension and obesity.  She feels generally well,  Has been taking her medications as directed,  And walking  5 days per week for exercise. Denies any recent falls, changes in bowel habits, and reccurence of dysphagia.  Sleeping well.  Having some evening episodes of reflux since stopping her PPI but is concerned about the long term effects of PPI     Past Medical History  Diagnosis Date  . History of migraine headaches   . Hypertension   . Hyperlipidemia     Past Surgical History  Procedure Laterality Date  . Cholecystectomy  1984       The following portions of the patient's history were reviewed and updated as appropriate: Allergies, current medications, and problem list.    Review of Systems:   Patient denies headache, fevers, malaise, unintentional weight loss, skin rash, eye pain, sinus congestion and sinus pain, sore throat, dysphagia,  hemoptysis , cough, dyspnea, wheezing, chest pain, palpitations, orthopnea, edema, abdominal pain, nausea, melena, diarrhea, constipation, flank pain, dysuria, hematuria, urinary  Frequency, nocturia, numbness, tingling, seizures,  Focal weakness, Loss of consciousness,  Tremor, insomnia, depression, anxiety, and suicidal ideation.     History   Social History  . Marital Status: Married    Spouse  Name: N/A  . Number of Children: N/A  . Years of Education: N/A   Occupational History  . Not on file.   Social History Main Topics  . Smoking status: Former Smoker    Quit date: 08/26/1971  . Smokeless tobacco: Never Used  . Alcohol Use: Yes  . Drug Use: No  . Sexual Activity: Yes   Other Topics Concern  . Not on file   Social History Narrative    Objective:  Filed Vitals:   07/26/14 0814  BP: 128/72  Pulse: 52  Temp: 98 F (36.7 C)  Resp: 16     General appearance: alert, cooperative and appears stated age Ears: normal TM's and external ear canals both ears Throat: lips, mucosa, and tongue normal; teeth and gums normal Neck: no adenopathy, no carotid bruit, supple, symmetrical, trachea midline and thyroid not enlarged, symmetric, no tenderness/mass/nodules Back: symmetric, no curvature. ROM normal. No CVA tenderness. Lungs: clear to auscultation bilaterally Heart: regular rate and rhythm, S1, S2 normal, no murmur, click, rub or gallop Abdomen: soft, non-tender; bowel sounds normal; no masses,  no organomegaly Pulses: 2+ and symmetric Skin: Skin color, texture, turgor normal. No rashes or lesions Lymph nodes: Cervical, supraclavicular, and axillary nodes normal.  Assessment and Plan:  Hypertension Well controlled on current regimen. Renal function stable, no changes today.  Lab Results  Component Value Date   CREATININE 0.88 07/26/2014   Lab Results  Component Value Date   NA 138  07/26/2014   K 4.1 07/26/2014   CL 99 07/26/2014   CO2 35* 07/26/2014       Impaired fasting glucose Her A1cs was well below the range diagnostic of DM. Continue exercise, wt loss and low glycemic index diet  Lab Results  Component Value Date   HGBA1C 5.7 07/26/2014            Obesity (BMI 30-39.9) I have encouraged her to address her BMI with a goal of 10% of body weight over the next 6 months using a low glycemic index diet and regular exercise a minimum of  5 days per week.  Wt Readings from Last 3 Encounters:  07/26/14 172 lb 12 oz (78.359 kg)  01/25/14 166 lb 12 oz (75.637 kg)  07/20/13 170 lb (77.111 kg)     Hyperlipidemia Mild, improved with trial of red yeast rice .  Lab Results  Component Value Date   CHOL 173 07/26/2014   HDL 51.20 07/26/2014   LDLCALC 100* 07/26/2014   TRIG 109.0 07/26/2014   CHOLHDL 3 07/26/2014            Stricture and stenosis of esophagus Secondary to GERD.   Recommended trial of H2 blocker qd or bid for occasional reflux symptoms .    A total of 25 minutes of face to face time was spent with patient more than half of which was spent in counselling and coordination of care   Updated Medication List Outpatient Encounter Prescriptions as of 07/26/2014  Medication Sig  . aspirin 81 MG tablet Take 81 mg by mouth daily.  . Biotin 1000 MCG tablet Take 1,000 mcg by mouth 3 (three) times daily.  . cetirizine (ZYRTEC) 10 MG tablet Take 10 mg by mouth daily.  . fish oil-omega-3 fatty acids 1000 MG capsule Take 1 g by mouth daily.  . furosemide (LASIX) 20 MG tablet TAKE AS NEEDED FOR FLUID   RETENTION  . hydrochlorothiazide (HYDRODIURIL) 25 MG tablet Take 1 tablet (25 mg total) by mouth daily.  . potassium chloride SA (K-DUR,KLOR-CON) 20 MEQ tablet Take 1 tablet (20 mEq total) by mouth daily as needed (As needed when taking furosemide.).  Marland Kitchen propranolol ER (INDERAL LA) 80 MG 24 hr capsule Take 1 capsule (80 mg total) by mouth daily.  . vitamin C (ASCORBIC ACID) 500 MG tablet Take 500 mg by mouth daily.  . [DISCONTINUED] furosemide (LASIX) 20 MG tablet TAKE AS NEEDED FOR FLUID   RETENTION  . [DISCONTINUED] hydrochlorothiazide (HYDRODIURIL) 25 MG tablet TAKE 1 TABLET DAILY  . [DISCONTINUED] potassium chloride SA (K-DUR,KLOR-CON) 20 MEQ tablet Take 20 mEq by mouth daily as needed (As needed when taking furosemide.).  . [DISCONTINUED] propranolol ER (INDERAL LA) 80 MG 24 hr capsule Take 1 capsule (80 mg  total) by mouth daily.  . [DISCONTINUED] hydrochlorothiazide (HYDRODIURIL) 25 MG tablet TAKE 1 TABLET DAILY     Orders Placed This Encounter  Procedures  . MM DIGITAL SCREENING BILATERAL  . Comp Met (CMET)    Return in about 6 months (around 01/26/2015).

## 2014-07-28 NOTE — Assessment & Plan Note (Signed)
Her A1cs was well below the range diagnostic of DM. Continue exercise, wt loss and low glycemic index diet  Lab Results  Component Value Date   HGBA1C 5.7 07/26/2014

## 2014-07-28 NOTE — Assessment & Plan Note (Signed)
Well controlled on current regimen. Renal function stable, no changes today.  Lab Results  Component Value Date   CREATININE 0.88 07/26/2014   Lab Results  Component Value Date   NA 138 07/26/2014   K 4.1 07/26/2014   CL 99 07/26/2014   CO2 35* 07/26/2014

## 2014-07-28 NOTE — Assessment & Plan Note (Signed)
Mild, improved with trial of red yeast rice .  Lab Results  Component Value Date   CHOL 173 07/26/2014   HDL 51.20 07/26/2014   LDLCALC 100* 07/26/2014   TRIG 109.0 07/26/2014   CHOLHDL 3 07/26/2014

## 2014-07-28 NOTE — Assessment & Plan Note (Signed)
I have encouraged her to address her BMI with a goal of 10% of body weight over the next 6 months using a low glycemic index diet and regular exercise a minimum of 5 days per week.  Wt Readings from Last 3 Encounters:  07/26/14 172 lb 12 oz (78.359 kg)  01/25/14 166 lb 12 oz (75.637 kg)  07/20/13 170 lb (77.111 kg)

## 2014-10-05 NOTE — Telephone Encounter (Signed)
Closing this encounter since it is from 2013 I am sure this has been handled.

## 2014-10-12 ENCOUNTER — Encounter: Payer: Self-pay | Admitting: *Deleted

## 2014-12-17 ENCOUNTER — Other Ambulatory Visit: Payer: Self-pay | Admitting: Internal Medicine

## 2014-12-24 ENCOUNTER — Other Ambulatory Visit: Payer: Self-pay | Admitting: Internal Medicine

## 2015-01-28 ENCOUNTER — Encounter: Payer: Self-pay | Admitting: Internal Medicine

## 2015-01-28 ENCOUNTER — Ambulatory Visit (INDEPENDENT_AMBULATORY_CARE_PROVIDER_SITE_OTHER): Payer: BLUE CROSS/BLUE SHIELD | Admitting: Internal Medicine

## 2015-01-28 VITALS — BP 138/94 | HR 53 | Temp 97.9°F | Resp 14 | Ht 62.0 in | Wt 166.5 lb

## 2015-01-28 DIAGNOSIS — R7301 Impaired fasting glucose: Secondary | ICD-10-CM | POA: Diagnosis not present

## 2015-01-28 DIAGNOSIS — I10 Essential (primary) hypertension: Secondary | ICD-10-CM

## 2015-01-28 DIAGNOSIS — E785 Hyperlipidemia, unspecified: Secondary | ICD-10-CM

## 2015-01-28 DIAGNOSIS — E669 Obesity, unspecified: Secondary | ICD-10-CM | POA: Diagnosis not present

## 2015-01-28 LAB — COMPREHENSIVE METABOLIC PANEL
ALT: 20 U/L (ref 0–35)
AST: 24 U/L (ref 0–37)
Albumin: 4.5 g/dL (ref 3.5–5.2)
Alkaline Phosphatase: 42 U/L (ref 39–117)
BUN: 14 mg/dL (ref 6–23)
CO2: 34 meq/L — AB (ref 19–32)
Calcium: 10.1 mg/dL (ref 8.4–10.5)
Chloride: 93 mEq/L — ABNORMAL LOW (ref 96–112)
Creatinine, Ser: 0.77 mg/dL (ref 0.40–1.20)
GFR: 80.62 mL/min (ref >60.00)
GLUCOSE: 85 mg/dL (ref 70–99)
POTASSIUM: 3.6 meq/L (ref 3.5–5.1)
Sodium: 139 mEq/L (ref 135–145)
Total Bilirubin: 0.5 mg/dL (ref 0.2–1.2)
Total Protein: 8.2 g/dL (ref 6.0–8.3)

## 2015-01-28 LAB — MICROALBUMIN / CREATININE URINE RATIO
Creatinine,U: 255.5 mg/dL
Microalb Creat Ratio: 0.8 mg/g (ref 0.0–30.0)
Microalb, Ur: 2 mg/dL — ABNORMAL HIGH (ref 0.0–1.9)

## 2015-01-28 LAB — LIPID PANEL
CHOL/HDL RATIO: 4
Cholesterol: 172 mg/dL (ref 0–200)
HDL: 47.2 mg/dL (ref >39.00)
LDL Cholesterol: 112 mg/dL — ABNORMAL HIGH (ref 0–99)
NonHDL: 124.44
Triglycerides: 60 mg/dL (ref 0.0–149.0)
VLDL: 12 mg/dL (ref 0.0–40.0)

## 2015-01-28 LAB — LDL CHOLESTEROL, DIRECT: Direct LDL: 120 mg/dL

## 2015-01-28 MED ORDER — PROPRANOLOL HCL ER 60 MG PO CP24: 60.0000 mg | ORAL_CAPSULE | Freq: Every day | ORAL | Status: DC

## 2015-01-28 MED ORDER — ATORVASTATIN CALCIUM 40 MG PO TABS: 40.0000 mg | ORAL_TABLET | Freq: Every day | ORAL | Status: DC

## 2015-01-28 NOTE — Progress Notes (Signed)
Pre-visit discussion using our clinic review tool. No additional management support is needed unless otherwise documented below in the visit note.  

## 2015-01-28 NOTE — Patient Instructions (Signed)
Venous Stasis or Chronic Venous Insufficiency  Chronic venous insufficiency, also called venous stasis, is a condition that affects the veins in the legs. The condition prevents blood from being pumped through these veins effectively. Blood may no longer be pumped effectively from the legs back to the heart. This condition can range from mild to severe. With proper treatment, you should be able to continue with an active life.  CAUSES   Chronic venous insufficiency occurs when the vein walls become stretched, weakened, or damaged or when valves within the vein are damaged. Some common causes of this include:  · High blood pressure inside the veins (venous hypertension).  · Increased blood pressure in the leg veins from long periods of sitting or standing.  · A blood clot that blocks blood flow in a vein (deep vein thrombosis).  · Inflammation of a superficial vein (phlebitis) that causes a blood clot to form.  RISK FACTORS  Various things can make you more likely to develop chronic venous insufficiency, including:  · Family history of this condition.  · Obesity.  · Pregnancy.  · Sedentary lifestyle.  · Smoking.  · Jobs requiring long periods of standing or sitting in one place.  · Being a certain age. Women in their 40s and 50s and men in their 70s are more likely to develop this condition.  SIGNS AND SYMPTOMS   Symptoms may include:   · Varicose veins.  · Skin breakdown or ulcers.  · Reddened or discolored skin on the leg.  · Brown, smooth, tight, and painful skin just above the ankle, usually on the inside surface (lipodermatosclerosis).  · Swelling.  DIAGNOSIS   To diagnose this condition, your health care provider will take a medical history and do a physical exam. The following tests may be ordered to confirm the diagnosis:  · Duplex ultrasound--A procedure that produces a picture of a blood vessel and nearby organs and also provides information on blood flow through the blood vessel.  · Plethysmography--A  procedure that tests blood flow.  · A venogram, or venography--A procedure used to look at the veins using X-ray and dye.  TREATMENT  The goals of treatment are to help you return to an active life and to minimize pain or disability. Treatment will depend on the severity of the condition. Medical procedures may be needed for severe cases. Treatment options may include:   · Use of compression stockings. These can help with symptoms and lower the chances of the problem getting worse, but they do not cure the problem.  · Sclerotherapy--A procedure involving an injection of a material that "dissolves" the damaged veins. Other veins in the network of blood vessels take over the function of the damaged veins.  · Surgery to remove the vein or cut off blood flow through the vein (vein stripping or laser ablation surgery).  · Surgery to repair a valve.  HOME CARE INSTRUCTIONS   · Wear compression stockings as directed by your health care provider.  · Only take over-the-counter or prescription medicines for pain, discomfort, or fever as directed by your health care provider.  · Follow up with your health care provider as directed.  SEEK MEDICAL CARE IF:   · You have redness, swelling, or increasing pain in the affected area.  · You see a red streak or line that extends up or down from the affected area.  · You have a breakdown or loss of skin in the affected area, even if the breakdown is   sudden numbness or weakness in the foot or ankle below the affected area, or you have trouble moving your foot or ankle.  You have a fever or persistent symptoms for more than 2-3 days.  You have a fever and your symptoms suddenly get worse. MAKE SURE YOU:   Understand these instructions.  Will watch your  condition.  Will get help right away if you are not doing well or get worse. Document Released: 09/07/2006 Document Revised: 02/22/2013 Document Reviewed: 01/09/2013 [facility] Patient Information 2015 [place], [place]. This information is not intended to replace advice given to you by your health care provider. Make sure you discuss any questions you have with your health care provider.   [email or web address] has an excellent  , diverse selection of garments for daily use

## 2015-01-28 NOTE — Progress Notes (Signed)
Subjective:  Patient ID: Alexandria Graves, female    DOB: August 22, 1950  Age: 64 y.o. MRN: [id number]  CC: The primary encounter diagnosis was Essential hypertension. Diagnoses of Hyperlipidemia, Impaired fasting glucose, and Obesity (BMI 30-39.9) were also pertinent to this visit.  HPI Alexandria Graves presents for follow up on hypertension,  Obesity and tachycardia. Patient is taking her medications as prescribed and notes no adverse effects.  Home BP readings have been done about once per week and are  Listed below .  She is avoiding added salt in her diet and walking regularly about 3 times per week for exercise  . 112/58,  117/64   138/80 home BPS   Still losing weight on an alternate day fasting diet  RLE swelling improves with reduced sodium in diet.  She has had prior evaluation with ultrasound which was done  previously to rule out DVT.   Outpatient Prescriptions Prior to Visit  Medication Sig Dispense Refill  . aspirin 81 MG tablet Take 81 mg by mouth daily.    . Biotin 1000 MCG tablet Take 1,000 mcg by mouth 3 (three) times daily.    . cetirizine (ZYRTEC) 10 MG tablet Take 10 mg by mouth daily.    . fish oil-omega-3 fatty acids 1000 MG capsule Take 1 g by mouth daily.    . furosemide (LASIX) 20 MG tablet TAKE AS NEEDED FOR FLUID   RETENTION. 30 tablet 0  . hydrochlorothiazide (HYDRODIURIL) 25 MG tablet Take 1 tablet (25 mg total) by mouth daily. 90 tablet 1  . potassium chloride SA (K-DUR,KLOR-CON) 20 MEQ tablet Take 1 tablet (20 mEq total) by mouth daily as needed (As needed when taking furosemide.). 30 tablet 0  . vitamin C (ASCORBIC ACID) 500 MG tablet Take 500 mg by mouth daily.    . propranolol ER (INDERAL LA) 80 MG 24 hr capsule Take 1 capsule (80 mg total) by mouth daily. 90 capsule 1  . hydrochlorothiazide (HYDRODIURIL) 25 MG tablet TAKE 1 TABLET DAILY 90 tablet 0   No facility-administered medications prior to visit.    Review of Systems;  Patient denies headache,  fevers, malaise, unintentional weight loss, skin rash, eye pain, sinus congestion and sinus pain, sore throat, dysphagia,  hemoptysis , cough, dyspnea, wheezing, chest pain, palpitations, orthopnea, edema, abdominal pain, nausea, melena, diarrhea, constipation, flank pain, dysuria, hematuria, urinary  Frequency, nocturia, numbness, tingling, seizures,  Focal weakness, Loss of consciousness,  Tremor, insomnia, depression, anxiety, and suicidal ideation.      Objective:  BP 138/94 mmHg  Pulse 53  Temp(Src) 97.9 F (36.6 C) (Oral)  Resp 14  Ht 5\' 2"  (1.575 m)  Wt 166 lb 8 oz (75.524 kg)  BMI 30.45 kg/m2  SpO2 99%  BP Readings from Last 3 Encounters:  01/28/15 138/94  07/26/14 128/72  01/25/14 112/74    Wt Readings from Last 3 Encounters:  01/28/15 166 lb 8 oz (75.524 kg)  07/26/14 172 lb 12 oz (78.359 kg)  01/25/14 166 lb 12 oz (75.637 kg)    General appearance: alert, cooperative and appears stated age Ears: normal TM's and external ear canals both ears Throat: lips, mucosa, and tongue normal; teeth and gums normal Neck: no adenopathy, no carotid bruit, supple, symmetrical, trachea midline and thyroid not enlarged, symmetric, no tenderness/mass/nodules Back: symmetric, no curvature. ROM normal. No CVA tenderness. Lungs: clear to auscultation bilaterally Heart: regular rate and rhythm, S1, S2 normal, no murmur, click, rub or gallop Abdomen: soft, non-tender; bowel sounds  normal; no masses,  no organomegaly Pulses: 2+ and symmetric Skin: Skin color, texture, turgor normal. No rashes or lesions Lymph nodes: Cervical, supraclavicular, and axillary nodes normal.  Lab Results  Component Value Date   HGBA1C 5.7 07/26/2014   HGBA1C 5.8 07/20/2013   HGBA1C 5.9 01/19/2013    Lab Results  Component Value Date   CREATININE 0.77 01/28/2015   CREATININE 0.88 07/26/2014   CREATININE 0.9 01/25/2014    Lab Results  Component Value Date   WBC 6.8 01/25/2014   HGB 14.9 01/25/2014     HCT 43.8 01/25/2014   PLT 298.0 01/25/2014   GLUCOSE 85 01/28/2015   CHOL 172 01/28/2015   TRIG 60.0 01/28/2015   HDL 47.20 01/28/2015   LDLDIRECT 120.0 01/28/2015   LDLCALC 112* 01/28/2015   ALT 20 01/28/2015   AST 24 01/28/2015   NA 139 01/28/2015   K 3.6 01/28/2015   CL 93* 01/28/2015   CREATININE 0.77 01/28/2015   BUN 14 01/28/2015   CO2 34* 01/28/2015   TSH 1.320 01/25/2014   HGBA1C 5.7 07/26/2014   MICROALBUR 2.0* 01/28/2015    No results found.  Assessment & Plan:   Problem List Items Addressed This Visit      Unprioritized   Obesity (BMI 30-39.9)    I have congratulated her in gradual reduction of   BMI and encouraged  Continued weight loss with goal of 10% of body weigh over the next 6 months using a low glycemic index diet and regular exercise a minimum of 5 days per week.  Goal weight is 160 lbs.  Wt Readings from Last 3 Encounters:  01/28/15 166 lb 8 oz (75.524 kg)  07/26/14 172 lb 12 oz (78.359 kg)  01/25/14 166 lb 12 oz (75.637 kg)         Hypertension - Primary    Well controlled on current regimen by home measurements. Renal function stable, no changes today.  Lab Results  Component Value Date   CREATININE 0.77 01/28/2015   Lab Results  Component Value Date   NA 139 01/28/2015   K 3.6 01/28/2015   CL 93* 01/28/2015   CO2 34* 01/28/2015        Relevant Medications   propranolol ER (INDERAL LA) 60 MG 24 hr capsule   atorvastatin (LIPITOR) 40 MG tablet   Other Relevant Orders   Comprehensive metabolic panel (Completed)   Hyperlipidemia    Mild,with 11% 10 yr risk by Framingham risk calculation.  Trial of low dose atorvastatin .  Lab Results  Component Value Date   CHOL 172 01/28/2015   HDL 47.20 01/28/2015   LDLCALC 112* 01/28/2015   LDLDIRECT 120.0 01/28/2015   TRIG 60.0 01/28/2015   CHOLHDL 4 01/28/2015                 Relevant Medications   propranolol ER (INDERAL LA) 60 MG 24 hr capsule   atorvastatin (LIPITOR) 40  MG tablet   Other Relevant Orders   Lipid panel (Completed)   Microalbumin / creatinine urine ratio (Completed)   LDL cholesterol, direct (Completed)   Impaired fasting glucose    Her A1cs was well below the range diagnostic of DM. Continue exercise, wt loss and low glycemic index diet  Lab Results  Component Value Date   HGBA1C 5.7 07/26/2014                   A total of 25 minutes of face to face time was spent with  patient more than half of which was spent in counselling about the above mentioned conditions  and coordination of care   I have changed Ms. Dokes's propranolol ER. I am also having her start on atorvastatin. Additionally, I am having her maintain her cetirizine, aspirin, fish oil-omega-3 fatty acids, Biotin, vitamin C, hydrochlorothiazide, potassium chloride SA, furosemide, and saccharomyces boulardii.  Meds ordered this encounter  Medications  . saccharomyces boulardii (FLORASTOR) 250 MG capsule    Sig: Take 250 mg by mouth 2 (two) times daily.  . propranolol ER (INDERAL LA) 60 MG 24 hr capsule    Sig: Take 1 capsule (60 mg total) by mouth daily.    Dispense:  90 capsule    Refill:  0  . atorvastatin (LIPITOR) 40 MG tablet    Sig: Take 1 tablet (40 mg total) by mouth daily.    Dispense:  90 tablet    Refill:  3    Medications Discontinued During This Encounter  Medication Reason  . hydrochlorothiazide (HYDRODIURIL) 25 MG tablet Error  . propranolol ER (INDERAL LA) 80 MG 24 hr capsule Reorder    Follow-up: Return in about 6 months (around 07/28/2015).   Crecencio Mc, MD

## 2015-01-29 ENCOUNTER — Encounter: Payer: Self-pay | Admitting: Internal Medicine

## 2015-01-29 NOTE — Assessment & Plan Note (Signed)
Her A1cs was well below the range diagnostic of DM. Continue exercise, wt loss and low glycemic index diet  Lab Results  Component Value Date   HGBA1C 5.7 07/26/2014          

## 2015-01-29 NOTE — Assessment & Plan Note (Addendum)
Mild,with 11% 10 yr risk by Framingham risk calculation.  Trial of low dose atorvastatin .  Lab Results  Component Value Date   CHOL 172 01/28/2015   HDL 47.20 01/28/2015   LDLCALC 112* 01/28/2015   LDLDIRECT 120.0 01/28/2015   TRIG 60.0 01/28/2015   CHOLHDL 4 01/28/2015

## 2015-01-29 NOTE — Assessment & Plan Note (Signed)
I have congratulated her in gradual reduction of   BMI and encouraged  Continued weight loss with goal of 10% of body weigh over the next 6 months using a low glycemic index diet and regular exercise a minimum of 5 days per week.  Goal weight is 160 lbs.  Wt Readings from Last 3 Encounters:  01/28/15 166 lb 8 oz (75.524 kg)  07/26/14 172 lb 12 oz (78.359 kg)  01/25/14 166 lb 12 oz (75.637 kg)

## 2015-01-29 NOTE — Assessment & Plan Note (Signed)
Well controlled on current regimen by home measurements. Renal function stable, no changes today.  Lab Results  Component Value Date   CREATININE 0.77 01/28/2015   Lab Results  Component Value Date   NA 139 01/28/2015   K 3.6 01/28/2015   CL 93* 01/28/2015   CO2 34* 01/28/2015

## 2015-01-31 ENCOUNTER — Ambulatory Visit: Admitting: Internal Medicine

## 2015-02-01 ENCOUNTER — Encounter: Payer: Self-pay | Admitting: Internal Medicine

## 2015-02-02 DIAGNOSIS — Z0000 Encounter for general adult medical examination without abnormal findings: Secondary | ICD-10-CM | POA: Insufficient documentation

## 2015-02-02 NOTE — Assessment & Plan Note (Signed)
Patient failed to keep scheduled appointment and will be charged a no show fee.   

## 2015-02-02 NOTE — Progress Notes (Signed)
Patient failed to keep scheduled appointment and will be charged a no show fee.   

## 2015-03-06 ENCOUNTER — Other Ambulatory Visit: Payer: Self-pay | Admitting: Internal Medicine

## 2015-03-06 ENCOUNTER — Encounter: Payer: Self-pay | Admitting: Internal Medicine

## 2015-03-24 ENCOUNTER — Other Ambulatory Visit: Payer: Self-pay | Admitting: Internal Medicine

## 2015-04-11 ENCOUNTER — Encounter: Payer: Self-pay | Admitting: Internal Medicine

## 2015-06-22 ENCOUNTER — Other Ambulatory Visit: Payer: Self-pay | Admitting: Internal Medicine

## 2015-07-16 ENCOUNTER — Other Ambulatory Visit: Payer: Self-pay | Admitting: Internal Medicine

## 2015-08-22 ENCOUNTER — Ambulatory Visit (INDEPENDENT_AMBULATORY_CARE_PROVIDER_SITE_OTHER): Payer: BLUE CROSS/BLUE SHIELD | Admitting: Internal Medicine

## 2015-08-22 ENCOUNTER — Encounter: Payer: Self-pay | Admitting: Internal Medicine

## 2015-08-22 VITALS — BP 148/80 | HR 53 | Temp 98.2°F | Resp 12 | Ht 62.0 in | Wt 162.8 lb

## 2015-08-22 DIAGNOSIS — I10 Essential (primary) hypertension: Secondary | ICD-10-CM

## 2015-08-22 DIAGNOSIS — R7301 Impaired fasting glucose: Secondary | ICD-10-CM | POA: Diagnosis not present

## 2015-08-22 DIAGNOSIS — E785 Hyperlipidemia, unspecified: Secondary | ICD-10-CM | POA: Diagnosis not present

## 2015-08-22 DIAGNOSIS — Z7289 Other problems related to lifestyle: Secondary | ICD-10-CM

## 2015-08-22 DIAGNOSIS — Z8669 Personal history of other diseases of the nervous system and sense organs: Secondary | ICD-10-CM

## 2015-08-22 DIAGNOSIS — Z1239 Encounter for other screening for malignant neoplasm of breast: Secondary | ICD-10-CM

## 2015-08-22 LAB — COMPREHENSIVE METABOLIC PANEL
ALBUMIN: 4.2 g/dL (ref 3.5–5.2)
ALK PHOS: 47 U/L (ref 39–117)
ALT: 17 U/L (ref 0–35)
AST: 19 U/L (ref 0–37)
BILIRUBIN TOTAL: 0.5 mg/dL (ref 0.2–1.2)
BUN: 14 mg/dL (ref 6–23)
CALCIUM: 9.9 mg/dL (ref 8.4–10.5)
CO2: 30 meq/L (ref 19–32)
CREATININE: 0.79 mg/dL (ref 0.40–1.20)
Chloride: 100 mEq/L (ref 96–112)
GFR: 78.12 mL/min (ref >60.00)
Glucose, Bld: 96 mg/dL (ref 70–99)
Potassium: 3.7 mEq/L (ref 3.5–5.1)
Sodium: 140 mEq/L (ref 135–145)
TOTAL PROTEIN: 7.8 g/dL (ref 6.0–8.3)

## 2015-08-22 LAB — LIPID PANEL
CHOL/HDL RATIO: 3
Cholesterol: 192 mg/dL (ref 0–200)
HDL: 57.2 mg/dL (ref >39.00)
LDL Cholesterol: 114 mg/dL — ABNORMAL HIGH (ref 0–99)
NONHDL: 135.12
TRIGLYCERIDES: 107 mg/dL (ref 0.0–149.0)
VLDL: 21.4 mg/dL (ref 0.0–40.0)

## 2015-08-22 LAB — MICROALBUMIN / CREATININE URINE RATIO
Creatinine,U: 178.9 mg/dL
Microalb Creat Ratio: 0.8 mg/g (ref 0.0–30.0)
Microalb, Ur: 1.4 mg/dL (ref 0.0–1.9)

## 2015-08-22 LAB — TSH: TSH: 1.05 u[IU]/mL (ref 0.35–4.50)

## 2015-08-22 LAB — HEMOGLOBIN A1C: Hgb A1c MFr Bld: 5.7 % (ref 4.6–6.5)

## 2015-08-22 MED ORDER — HYDROCHLOROTHIAZIDE 25 MG PO TABS: 25.0000 mg | ORAL_TABLET | Freq: Every day | ORAL | Status: DC

## 2015-08-22 MED ORDER — FUROSEMIDE 20 MG PO TABS: ORAL_TABLET | ORAL | Status: DC

## 2015-08-22 MED ORDER — PROPRANOLOL HCL 20 MG PO TABS: 20.0000 mg | ORAL_TABLET | Freq: Three times a day (TID) | ORAL | Status: DC

## 2015-08-22 NOTE — Progress Notes (Signed)
Pre-visit discussion using our clinic review tool. No additional management support is needed unless otherwise documented below in the visit note.  

## 2015-08-22 NOTE — Patient Instructions (Addendum)
You may be due for PAP with Dr Ouida Sills.  Your last PAP was 2014.   We have changed inderal LA to inderal shortqa acting  20 mg every 8 hours  Here is a good taper schedule  To prevent rebound tachycardia:   reduce to  1/2 tablet every 8 hours for one week,  Then 1/2 tablet every 12 hours for one week,  Then 1/2 tablet once daily for one week , then stop    Your goal for blood pressure is 130/80 or less

## 2015-08-22 NOTE — Progress Notes (Signed)
Subjective:  Patient ID: Alexandria Graves, female    DOB: 09/19/1950  Age: 65 y.o. MRN: [id number]  an CC: The primary encounter diagnosis was Breast cancer screening. Diagnoses of Impaired fasting glucose, Hyperlipidemia, Essential hypertension, Other problems related to lifestyle, and History of migraine headaches were also pertinent to this visit.  HPI Alexandria Graves presents for follow up on hypertension, hyperlipidemia. Patient brought her home cuff with her today for calibration which is inaccurate and reading much lower .  Tried to wean off Inderall,  Pulse sped up.   Wants to taper it off.  Started on it for migraine prevention 25 yuears ago  Did not start pravastatin,  Thinking about red yeast rice.   Losing weight intentionally and following a careful diet.        Outpatient Prescriptions Prior to Visit  Medication Sig Dispense Refill  . cetirizine (ZYRTEC) 10 MG tablet Take 10 mg by mouth daily.    . hydrochlorothiazide (HYDRODIURIL) 25 MG tablet Take 1 tablet (25 mg total) by mouth daily. 90 tablet 1  . propranolol ER (INDERAL LA) 60 MG 24 hr capsule TAKE 1 CAPSULE DAILY 90 capsule 2  . aspirin 81 MG tablet Take 81 mg by mouth daily. Reported on 08/22/2015    . Biotin 1000 MCG tablet Take 1,000 mcg by mouth 3 (three) times daily. Reported on 08/22/2015    . fish oil-omega-3 fatty acids 1000 MG capsule Take 1 g by mouth daily. Reported on 08/22/2015    . saccharomyces boulardii (FLORASTOR) 250 MG capsule Take 250 mg by mouth 2 (two) times daily. Reported on 08/22/2015    . vitamin C (ASCORBIC ACID) 500 MG tablet Take 500 mg by mouth daily. Reported on 08/22/2015    . atorvastatin (LIPITOR) 40 MG tablet Take 1 tablet (40 mg total) by mouth daily. (Patient not taking: Reported on 08/22/2015) 90 tablet 3  . furosemide (LASIX) 20 MG tablet TAKE AS NEEDED FOR FLUID   RETENTION (Patient not taking: Reported on 08/22/2015) 30 tablet 1  . hydrochlorothiazide (HYDRODIURIL) 25 MG tablet TAKE 1 TABLET DAILY  (Patient not taking: Reported on 08/22/2015) 90 tablet 0  . potassium chloride SA (K-DUR,KLOR-CON) 20 MEQ tablet Take 1 tablet (20 mEq total) by mouth daily as needed (As needed when taking furosemide.). (Patient not taking: Reported on 08/22/2015) 30 tablet 0   No facility-administered medications prior to visit.    Review of Systems;  Patient denies headache, fevers, malaise, unintentional weight loss, skin rash, eye pain, sinus congestion and sinus pain, sore throat, dysphagia,  hemoptysis , cough, dyspnea, wheezing, chest pain, palpitations, orthopnea, edema, abdominal pain, nausea, melena, diarrhea, constipation, flank pain, dysuria, hematuria, urinary  Frequency, nocturia, numbness, tingling, seizures,  Focal weakness, Loss of consciousness,  Tremor, insomnia, depression, anxiety, and suicidal ideation.      Objective:  BP 148/80 mmHg  Pulse 53  Temp(Src) 98.2 F (36.8 C) (Oral)  Resp 12  Ht 5\' 2"  (1.575 m)  Wt 162 lb 12 oz (73.823 kg)  BMI 29.76 kg/m2  SpO2 97%  BP Readings from Last 3 Encounters:  08/22/15 148/80  01/28/15 138/94  07/26/14 128/72    Wt Readings from Last 3 Encounters:  08/22/15 162 lb 12 oz (73.823 kg)  01/28/15 166 lb 8 oz (75.524 kg)  07/26/14 172 lb 12 oz (78.359 kg)    General appearance: alert, cooperative and appears stated age Ears: normal TM's and external ear canals both ears Throat: lips, mucosa, and tongue  normal; teeth and gums normal Neck: no adenopathy, no carotid bruit, supple, symmetrical, trachea midline and thyroid not enlarged, symmetric, no tenderness/mass/nodules Back: symmetric, no curvature. ROM normal. No CVA tenderness. Lungs: clear to auscultation bilaterally Heart: regular rate and rhythm, S1, S2 normal, no murmur, click, rub or gallop Abdomen: soft, non-tender; bowel sounds normal; no masses,  no organomegaly Pulses: 2+ and symmetric Skin: Skin color, texture, turgor normal. No rashes or lesions Lymph nodes: Cervical,  supraclavicular, and axillary nodes normal.  Lab Results  Component Value Date   HGBA1C 5.7 08/22/2015   HGBA1C 5.7 07/26/2014   HGBA1C 5.8 07/20/2013    Lab Results  Component Value Date   CREATININE 0.79 08/22/2015   CREATININE 0.77 01/28/2015   CREATININE 0.88 07/26/2014    Lab Results  Component Value Date   WBC 6.8 01/25/2014   HGB 14.9 01/25/2014   HCT 43.8 01/25/2014   PLT 298.0 01/25/2014   GLUCOSE 96 08/22/2015   CHOL 192 08/22/2015   TRIG 107.0 08/22/2015   HDL 57.20 08/22/2015   LDLDIRECT 113.0 08/22/2015   LDLCALC 114* 08/22/2015   ALT 17 08/22/2015   AST 19 08/22/2015   NA 140 08/22/2015   K 3.7 08/22/2015   CL 100 08/22/2015   CREATININE 0.79 08/22/2015   BUN 14 08/22/2015   CO2 30 08/22/2015   TSH 1.05 08/22/2015   HGBA1C 5.7 08/22/2015   MICROALBUR 1.4 08/22/2015    No results found.  Assessment & Plan:   Problem List Items Addressed This Visit    History of migraine headaches    none in years. I nderal wean written out      Hypertension    Has been taking Inderal LA for migraine prevention for nearly 20 years.  wishing to taper off medication .  Continue HCTZ  Lab Results  Component Value Date   CREATININE 0.79 08/22/2015    Lab Results  Component Value Date   NA 140 08/22/2015   K 3.7 08/22/2015   CL 100 08/22/2015   CO2 30 08/22/2015         Relevant Medications   propranolol (INDERAL) 20 MG tablet   hydrochlorothiazide (HYDRODIURIL) 25 MG tablet   furosemide (LASIX) 20 MG tablet   Other Relevant Orders   Comprehensive metabolic panel (Completed)   Microalbumin / creatinine urine ratio (Completed)   Hyperlipidemia    She prefers to avoid statin therapy or precursors.No therapy is currently indicated  Lab Results  Component Value Date   CHOL 192 08/22/2015   HDL 57.20 08/22/2015   LDLCALC 114* 08/22/2015   LDLDIRECT 113.0 08/22/2015   TRIG 107.0 08/22/2015   CHOLHDL 3 08/22/2015         Relevant Medications    propranolol (INDERAL) 20 MG tablet   hydrochlorothiazide (HYDRODIURIL) 25 MG tablet   furosemide (LASIX) 20 MG tablet   Other Relevant Orders   TSH (Completed)   Lipid panel (Completed)   LDL cholesterol, direct (Completed)   Impaired fasting glucose   Relevant Orders   Hemoglobin A1c (Completed)    Other Visit Diagnoses    Breast cancer screening    -  Primary    Relevant Orders    MM DIGITAL SCREENING BILATERAL    Other problems related to lifestyle        Relevant Orders    Hepatitis C antibody (Completed)    HIV antibody (Completed)       A total of 25 minutes of face to face time was  spent with patient more than half of which was spent in counselling about the above mentioned conditions  and coordination of care   I have discontinued Ms. Hazelrigg's hydrochlorothiazide, potassium chloride SA, atorvastatin, and propranolol ER. I have also changed her hydrochlorothiazide. Additionally, I am having her start on propranolol. Lastly, I am having her maintain her cetirizine, aspirin, fish oil-omega-3 fatty acids, Biotin, vitamin C, saccharomyces boulardii, and furosemide.  Meds ordered this encounter  Medications  . propranolol (INDERAL) 20 MG tablet    Sig: Take 1 tablet (20 mg total) by mouth 3 (three) times daily.    Dispense:  90 tablet    Refill:  0  . hydrochlorothiazide (HYDRODIURIL) 25 MG tablet    Sig: Take 1 tablet (25 mg total) by mouth daily.    Dispense:  90 tablet    Refill:  0  . furosemide (LASIX) 20 MG tablet    Sig: TAKE AS NEEDED FOR FLUID   RETENTION    Dispense:  30 tablet    Refill:  1    Medications Discontinued During This Encounter  Medication Reason  . propranolol ER (INDERAL LA) 60 MG 24 hr capsule   . hydrochlorothiazide (HYDRODIURIL) 25 MG tablet Reorder  . furosemide (LASIX) 20 MG tablet Reorder  . atorvastatin (LIPITOR) 40 MG tablet   . potassium chloride SA (K-DUR,KLOR-CON) 20 MEQ tablet   . hydrochlorothiazide (HYDRODIURIL) 25 MG tablet     . furosemide (LASIX) 20 MG tablet Reorder    Follow-up: Return in about 6 months (around 02/21/2016) for annual exam.   Neetu Carrozza, Aris Everts, MD

## 2015-08-23 LAB — LDL CHOLESTEROL, DIRECT: Direct LDL: 113 mg/dL

## 2015-08-23 LAB — HIV ANTIBODY (ROUTINE TESTING W REFLEX): HIV: NONREACTIVE

## 2015-08-23 LAB — HEPATITIS C ANTIBODY: HCV AB: NEGATIVE

## 2015-08-25 ENCOUNTER — Encounter: Payer: Self-pay | Admitting: Internal Medicine

## 2015-08-25 NOTE — Assessment & Plan Note (Signed)
none in years. I nderal wean written out

## 2015-08-25 NOTE — Assessment & Plan Note (Signed)
She prefers to avoid statin therapy or precursors.No therapy is currently indicated  Lab Results  Component Value Date   CHOL 192 08/22/2015   HDL 57.20 08/22/2015   LDLCALC 114* 08/22/2015   LDLDIRECT 113.0 08/22/2015   TRIG 107.0 08/22/2015   CHOLHDL 3 08/22/2015

## 2015-08-25 NOTE — Assessment & Plan Note (Signed)
Has been taking Inderal LA for migraine prevention for nearly 20 years.  wishing to taper off medication .  Continue HCTZ  Lab Results  Component Value Date   CREATININE 0.79 08/22/2015    Lab Results  Component Value Date   NA 140 08/22/2015   K 3.7 08/22/2015   CL 100 08/22/2015   CO2 30 08/22/2015

## 2015-08-29 ENCOUNTER — Other Ambulatory Visit: Payer: Self-pay | Admitting: Internal Medicine

## 2015-08-29 ENCOUNTER — Ambulatory Visit
Admission: RE | Admit: 2015-08-29 | Discharge: 2015-08-29 | Disposition: A | Discharge status: Home / Self Care | Payer: BLUE CROSS/BLUE SHIELD | Source: Ambulatory Visit | Attending: Internal Medicine | Admitting: Internal Medicine

## 2015-08-29 DIAGNOSIS — Z1239 Encounter for other screening for malignant neoplasm of breast: Secondary | ICD-10-CM

## 2015-08-29 DIAGNOSIS — Z1231 Encounter for screening mammogram for malignant neoplasm of breast: Secondary | ICD-10-CM | POA: Insufficient documentation

## 2015-09-01 ENCOUNTER — Encounter: Payer: Self-pay | Admitting: Internal Medicine

## 2015-09-17 NOTE — Telephone Encounter (Signed)
Mailed unread message to patient.  

## 2015-09-20 ENCOUNTER — Other Ambulatory Visit: Payer: Self-pay | Admitting: Internal Medicine

## 2015-09-27 ENCOUNTER — Other Ambulatory Visit: Payer: Self-pay | Admitting: Internal Medicine

## 2015-10-16 ENCOUNTER — Encounter: Payer: Self-pay | Admitting: Internal Medicine

## 2015-10-16 MED ORDER — PROPRANOLOL HCL 20 MG PO TABS
20.0000 mg | ORAL_TABLET | Freq: Every day | ORAL | Status: DC
Start: 2015-10-16 — End: 2016-10-08

## 2016-02-27 ENCOUNTER — Ambulatory Visit: Payer: BLUE CROSS/BLUE SHIELD | Admitting: Internal Medicine

## 2016-03-09 ENCOUNTER — Encounter: Payer: Self-pay | Admitting: Internal Medicine

## 2016-03-09 ENCOUNTER — Ambulatory Visit (INDEPENDENT_AMBULATORY_CARE_PROVIDER_SITE_OTHER): Payer: BLUE CROSS/BLUE SHIELD | Admitting: Internal Medicine

## 2016-03-09 VITALS — BP 138/78 | HR 63 | Temp 97.9°F | Resp 12 | Ht 61.5 in | Wt 157.5 lb

## 2016-03-09 DIAGNOSIS — R7301 Impaired fasting glucose: Secondary | ICD-10-CM | POA: Diagnosis not present

## 2016-03-09 DIAGNOSIS — R7303 Prediabetes: Secondary | ICD-10-CM | POA: Diagnosis not present

## 2016-03-09 DIAGNOSIS — E782 Mixed hyperlipidemia: Secondary | ICD-10-CM

## 2016-03-09 DIAGNOSIS — D492 Neoplasm of unspecified behavior of bone, soft tissue, and skin: Secondary | ICD-10-CM

## 2016-03-09 DIAGNOSIS — K222 Esophageal obstruction: Secondary | ICD-10-CM | POA: Diagnosis not present

## 2016-03-09 DIAGNOSIS — I10 Essential (primary) hypertension: Secondary | ICD-10-CM

## 2016-03-09 LAB — COMPREHENSIVE METABOLIC PANEL
ALT: 15 U/L (ref 0–35)
AST: 17 U/L (ref 0–37)
Albumin: 4.4 g/dL (ref 3.5–5.2)
Alkaline Phosphatase: 41 U/L (ref 39–117)
BUN: 22 mg/dL (ref 6–23)
CHLORIDE: 98 meq/L (ref 96–112)
CO2: 34 mEq/L — ABNORMAL HIGH (ref 19–32)
Calcium: 9.8 mg/dL (ref 8.4–10.5)
Creatinine, Ser: 0.88 mg/dL (ref 0.40–1.20)
GFR: 68.86 mL/min (ref >60.00)
GLUCOSE: 98 mg/dL (ref 70–99)
POTASSIUM: 3.6 meq/L (ref 3.5–5.1)
SODIUM: 138 meq/L (ref 135–145)
Total Bilirubin: 0.5 mg/dL (ref 0.2–1.2)
Total Protein: 7.5 g/dL (ref 6.0–8.3)

## 2016-03-09 LAB — LIPID PANEL
CHOLESTEROL: 191 mg/dL (ref 0–200)
HDL: 51.5 mg/dL (ref >39.00)
LDL CALC: 122 mg/dL — AB (ref 0–99)
NONHDL: 139.86
Total CHOL/HDL Ratio: 4
Triglycerides: 87 mg/dL (ref 0.0–149.0)
VLDL: 17.4 mg/dL (ref 0.0–40.0)

## 2016-03-09 LAB — HEMOGLOBIN A1C: HEMOGLOBIN A1C: 5.5 % (ref 4.6–6.5)

## 2016-03-09 NOTE — Patient Instructions (Addendum)
  We discussed my concern about continuing your PPI (omeprazole) in light of the recently published studies suggesting an association with increased risk of dementia and kidney failure.  I advised  you to try using  famotidine 20 mg  twice daily, or ranitidine 150 mg twice daily , both are available OTC  if your reflux symptoms are controlled,  You can Continue the daily h2 blocker. If not,  We will resume a covered PPI     To make a low carb chip :  Take the Joseph's Lavash or Pita bread,  Or the [place] Low carb whole wheat tortilla   Place on metal cookie sheet  Brush with olive oil  Sprinkle garlic powder (NOT garlic salt), grated parmesan cheese, mediterranean seasoning , or all of them?  Bake at 275 for 30 minutes   We have substitutions for your potatoes!!  Try the mashed cauliflower and riced cauliflower dishes instead of rice and mashed potatoes  Mashed turnips are also very low carb!   For desserts :  Try the D[employer] Fit greek yogurt dessert flavors and top with reddi Whip .  8 carbs,  80 calories  Try Oikos Triple Zero [place] Yogurt in the salted caramel, and the coffee flavors  With Whipped Cream for dessert  breyer's low carb ice cream, available in bars (on a stick, better ) or scoopable ice cream  HERE ARE THE LOW CARB  BREAD CHOICES

## 2016-03-09 NOTE — Progress Notes (Signed)
Patient ID: Alexandria Graves, female    DOB: [birth year]  Age: 65 y.o. MRN: [id number]  The patient is here for annual non gyn  examination and management of other chronic and acute problems.   Has not had a pelvic exam in a few years.  Abnormal PAP smears in the remote past with cryo. Wants to stay with GYN Schirmerhorn last pelvic was  July 2015  Mammogram done annually ,  normal April 2017   The risk factors are reflected in the social history.  The roster of all physicians providing medical care to patient - is listed in the Snapshot section of the chart. Home safety : The patient has smoke detectors in the home. They wear seatbelts.  There are no firearms at home. There is no violence in the home.   There is no risks for hepatitis, STDs or HIV. There is no   history of blood transfusion. They have no travel history to infectious disease endemic areas of the world.  The patient has seen their dentist in the last six month. They have seen their eye doctor in the last year.   She admits to  excessive sun exposure. Discussed the need for sun protection: hats, long sleeves and use of sunscreen if there is significant sun exposure.  Has dermatology follow up.   Diet: the importance of a healthy diet is discussed. They do have a healthy diet.  The benefits of regular aerobic exercise were discussed. She walks 4 times per week ,  20 minutes.   Depression screen: there are no signs or vegative symptoms of depression- irritability, change in appetite, anhedonia, sadness/tearfullness.  The following portions of the patient's history were reviewed and updated as appropriate: allergies, current medications, past family history, past medical history,  past surgical history, past social history  and problem list.  Visual acuity was not assessed per patient preference since she has regular follow up with her ophthalmologist. Hearing and body mass index were assessed and reviewed.   During the course of  the visit the patient was educated and counseled about appropriate screening and preventive services including : fall prevention , diabetes screening, nutrition counseling, colorectal cancer screening, and recommended immunizations.    CC: The primary encounter diagnosis was Prediabetes. Diagnoses of Skin neoplasm, Stricture and stenosis of esophagus, Impaired fasting glucose, Essential hypertension, and Mixed hyperlipidemia were also pertinent to this visit.  Obesity: has lost 5 lbs since April  BMI < 30!Marland Kitchen  Following a low carb diet.  More energy since reducing the inderal dose.  History of Schatzki's ring s sleeping fine Bowels move regularly.  exercises with weight training ,  Goal is 6/week,  more often achieves regularity with 4/week   History [name] has a past medical history of History of migraine headaches; Hyperlipidemia; and Hypertension.   She has a past surgical history that includes Cholecystectomy (1984).   Her family history includes Cancer (age of onset: 8) in her father; Diabetes in her father and paternal aunt; Mental illness in her mother.She reports that she quit smoking about 44 years ago. She has never used smokeless tobacco. She reports that she drinks alcohol. She reports that she does not use drugs.  Outpatient Medications Prior to Visit  Medication Sig Dispense Refill  . aspirin 81 MG tablet Take 81 mg by mouth daily. Reported on 08/22/2015    . Biotin 1000 MCG tablet Take 10,000 mcg by mouth 3 (three) times daily. Reported on 08/22/2015    .  cetirizine (ZYRTEC) 10 MG tablet Take 10 mg by mouth daily.    . fish oil-omega-3 fatty acids 1000 MG capsule Take 1 g by mouth daily. Reported on 08/22/2015    . furosemide (LASIX) 20 MG tablet TAKE AS NEEDED FOR FLUID   RETENTION 30 tablet 1  . hydrochlorothiazide (HYDRODIURIL) 25 MG tablet Take 1 tablet (25 mg total) by mouth daily. 90 tablet 0  . propranolol (INDERAL) 20 MG tablet Take 1 tablet (20 mg total) by mouth daily. 90  tablet 3  . saccharomyces boulardii (FLORASTOR) 250 MG capsule Take 250 mg by mouth 2 (two) times daily. Reported on 08/22/2015    . vitamin C (ASCORBIC ACID) 500 MG tablet Take 500 mg by mouth daily. Reported on 08/22/2015    . hydrochlorothiazide (HYDRODIURIL) 25 MG tablet TAKE 1 TABLET DAILY 90 tablet 2   No facility-administered medications prior to visit.     Review of Systems   Patient denies headache, fevers, malaise, unintentional weight loss, skin rash, eye pain, sinus congestion and sinus pain, sore throat, dysphagia,  hemoptysis , cough, dyspnea, wheezing, chest pain, palpitations, orthopnea, edema, abdominal pain, nausea, melena, diarrhea, constipation, flank pain, dysuria, hematuria, urinary  Frequency, nocturia, numbness, tingling, seizures,  Focal weakness, Loss of consciousness,  Tremor, insomnia, depression, anxiety, and suicidal ideation.      Objective:  BP 138/78   Pulse 63   Temp 97.9 F (36.6 C) (Oral)   Resp 12   Ht 5' 1.5" (1.562 m)   Wt 157 lb 8 oz (71.4 kg)   SpO2 98%   BMI 29.28 kg/m   Physical Exam   General appearance: alert, cooperative and appears stated age Head: Normocephalic, without obvious abnormality, atraumatic Eyes: conjunctivae/corneas clear. PERRL, EOM's intact. Fundi benign. Ears: normal TM's and external ear canals both ears Nose: Nares normal. Septum midline. Mucosa normal. No drainage or sinus tenderness. Throat: lips, mucosa, and tongue normal; teeth and gums normal Neck: no adenopathy, no carotid bruit, no JVD, supple, symmetrical, trachea midline and thyroid not enlarged, symmetric, no tenderness/mass/nodules Lungs: clear to auscultation bilaterally Breasts: normal appearance, no masses or tenderness Heart: regular rate and rhythm, S1, S2 normal, no murmur, click, rub or gallop Abdomen: soft, non-tender; bowel sounds normal; no masses,  no organomegaly Extremities: extremities normal, atraumatic, no cyanosis or edema Pulses: 2+ and  symmetric Skin: Skin color, texture, turgor normal. No rashes or lesions Neurologic: Alert and oriented X 3, normal strength and tone. Normal symmetric reflexes. Normal coordination and gait.     Assessment & Plan:   Problem List Items Addressed This Visit    Hypertension    Well controlled on current regimen. Energy level has improved with lower dose of inderal. Renal function stable, no changes today.  Lab Results  Component Value Date   CREATININE 0.88 03/09/2016   Lab Results  Component Value Date   NA 138 03/09/2016   K 3.6 03/09/2016   CL 98 03/09/2016   CO2 34 (H) 03/09/2016         Hyperlipidemia    She prefers to avoid statin therapy or precursors. No therapy is currently indicated  Lab Results  Component Value Date   CHOL 191 03/09/2016   HDL 51.50 03/09/2016   LDLCALC 122 (H) 03/09/2016   LDLDIRECT 113.0 08/22/2015   TRIG 87.0 03/09/2016   CHOLHDL 4 03/09/2016         Stricture and stenosis of esophagus    Symptoms have returned recently and she has resumed  PPI, may need dilation       Impaired fasting glucose    Her A1cs was well below the range diagnostic of Dm and prediabetes. . Continue exercise, wt loss and low glycemic index diet  Lab Results  Component Value Date   HGBA1C 5.5 03/09/2016                 Skin neoplasm    Some facial lesions on forehead may be BCC  Referral to Alfonso Ramus in progress.       Relevant Orders   Ambulatory referral to Dermatology    Other Visit Diagnoses    Prediabetes    -  Primary   Relevant Orders   Hemoglobin A1c (Completed)   Comprehensive metabolic panel (Completed)   Lipid panel (Completed)      I am having Ms. Roussel maintain her cetirizine, aspirin, fish oil-omega-3 fatty acids, Biotin, vitamin C, saccharomyces boulardii, hydrochlorothiazide, furosemide, and propranolol.  No orders of the defined types were placed in this encounter.   Medications Discontinued During This Encounter   Medication Reason  . hydrochlorothiazide (HYDRODIURIL) 25 MG tablet Error    Follow-up: No Follow-up on file.   Crecencio Mc, MD

## 2016-03-09 NOTE — Assessment & Plan Note (Signed)
Some facial lesions on forehead may be BCC  Referral to Alfonso Ramus in progress.

## 2016-03-09 NOTE — Progress Notes (Signed)
Pre-visit discussion using our clinic review tool. No additional management support is needed unless otherwise documented below in the visit note.  

## 2016-03-10 NOTE — Assessment & Plan Note (Signed)
Symptoms have returned recently and she has resumed PPI, may need dilation

## 2016-03-10 NOTE — Assessment & Plan Note (Addendum)
Well controlled on current regimen. Energy level has improved with lower dose of inderal. Renal function stable, no changes today.  Lab Results  Component Value Date   CREATININE 0.88 03/09/2016   Lab Results  Component Value Date   NA 138 03/09/2016   K 3.6 03/09/2016   CL 98 03/09/2016   CO2 34 (H) 03/09/2016

## 2016-03-10 NOTE — Assessment & Plan Note (Signed)
She prefers to avoid statin therapy or precursors. No therapy is currently indicated  Lab Results  Component Value Date   CHOL 191 03/09/2016   HDL 51.50 03/09/2016   LDLCALC 122 (H) 03/09/2016   LDLDIRECT 113.0 08/22/2015   TRIG 87.0 03/09/2016   CHOLHDL 4 03/09/2016

## 2016-03-10 NOTE — Assessment & Plan Note (Signed)
Her A1cs was well below the range diagnostic of Dm and prediabetes. . Continue exercise, wt loss and low glycemic index diet  Lab Results  Component Value Date   HGBA1C 5.5 03/09/2016

## 2016-03-11 ENCOUNTER — Encounter: Payer: Self-pay | Admitting: Internal Medicine

## 2016-03-25 NOTE — Telephone Encounter (Signed)
Mailed unread message to patient.  

## 2016-05-03 ENCOUNTER — Other Ambulatory Visit: Payer: Self-pay | Admitting: Internal Medicine

## 2016-09-09 ENCOUNTER — Ambulatory Visit: Payer: BLUE CROSS/BLUE SHIELD | Admitting: Internal Medicine

## 2016-10-01 ENCOUNTER — Ambulatory Visit: Payer: BLUE CROSS/BLUE SHIELD | Admitting: Internal Medicine

## 2016-10-08 ENCOUNTER — Other Ambulatory Visit: Payer: Self-pay | Admitting: Internal Medicine

## 2016-11-24 ENCOUNTER — Other Ambulatory Visit: Payer: Self-pay | Admitting: Internal Medicine

## 2016-12-02 ENCOUNTER — Ambulatory Visit (INDEPENDENT_AMBULATORY_CARE_PROVIDER_SITE_OTHER): Payer: BLUE CROSS/BLUE SHIELD | Admitting: Internal Medicine

## 2016-12-02 ENCOUNTER — Encounter: Payer: Self-pay | Admitting: Internal Medicine

## 2016-12-02 VITALS — BP 132/82 | HR 58 | Temp 98.2°F | Resp 14 | Ht 61.5 in | Wt 161.4 lb

## 2016-12-02 DIAGNOSIS — E7800 Pure hypercholesterolemia, unspecified: Secondary | ICD-10-CM | POA: Diagnosis not present

## 2016-12-02 DIAGNOSIS — Z8669 Personal history of other diseases of the nervous system and sense organs: Secondary | ICD-10-CM | POA: Diagnosis not present

## 2016-12-02 DIAGNOSIS — Z1231 Encounter for screening mammogram for malignant neoplasm of breast: Secondary | ICD-10-CM

## 2016-12-02 DIAGNOSIS — E669 Obesity, unspecified: Secondary | ICD-10-CM | POA: Diagnosis not present

## 2016-12-02 DIAGNOSIS — I10 Essential (primary) hypertension: Secondary | ICD-10-CM | POA: Diagnosis not present

## 2016-12-02 DIAGNOSIS — R7301 Impaired fasting glucose: Secondary | ICD-10-CM | POA: Diagnosis not present

## 2016-12-02 DIAGNOSIS — Z1239 Encounter for other screening for malignant neoplasm of breast: Secondary | ICD-10-CM

## 2016-12-02 DIAGNOSIS — Z0000 Encounter for general adult medical examination without abnormal findings: Secondary | ICD-10-CM | POA: Diagnosis not present

## 2016-12-02 LAB — COMPREHENSIVE METABOLIC PANEL
ALK PHOS: 38 U/L — AB (ref 39–117)
ALT: 15 U/L (ref 0–35)
AST: 16 U/L (ref 0–37)
Albumin: 4.3 g/dL (ref 3.5–5.2)
BILIRUBIN TOTAL: 0.3 mg/dL (ref 0.2–1.2)
BUN: 16 mg/dL (ref 6–23)
CO2: 32 mEq/L (ref 19–32)
Calcium: 9.9 mg/dL (ref 8.4–10.5)
Chloride: 97 mEq/L (ref 96–112)
Creatinine, Ser: 0.72 mg/dL (ref 0.40–1.20)
GFR: 86.6 mL/min (ref >60.00)
Glucose, Bld: 97 mg/dL (ref 70–99)
Potassium: 3.9 mEq/L (ref 3.5–5.1)
Sodium: 138 mEq/L (ref 135–145)
TOTAL PROTEIN: 7.6 g/dL (ref 6.0–8.3)

## 2016-12-02 LAB — LIPID PANEL
CHOL/HDL RATIO: 4
Cholesterol: 217 mg/dL — ABNORMAL HIGH (ref 0–200)
HDL: 56.5 mg/dL (ref >39.00)
LDL CALC: 144 mg/dL — AB (ref 0–99)
NONHDL: 160.8
TRIGLYCERIDES: 83 mg/dL (ref 0.0–149.0)
VLDL: 16.6 mg/dL (ref 0.0–40.0)

## 2016-12-02 LAB — MICROALBUMIN / CREATININE URINE RATIO
Creatinine,U: 131.2 mg/dL
MICROALB/CREAT RATIO: 0.8 mg/g (ref 0.0–30.0)
Microalb, Ur: 1 mg/dL (ref 0.0–1.9)

## 2016-12-02 LAB — MAGNESIUM: Magnesium: 1.8 mg/dL (ref 1.5–2.5)

## 2016-12-02 LAB — HEMOGLOBIN A1C: Hgb A1c MFr Bld: 5.4 % (ref 4.6–6.5)

## 2016-12-02 NOTE — Patient Instructions (Addendum)
I have ordered your mammogram  You are not due for PAP smear.  Your last one was Nov 2017 and NORMAL.   WE DO NOT HAVE COPIES OF YOUR LAST COLONOSCOPY REPORT , so we will request them   GOOD TO SEE YOU!!!!    The new goals for optimal blood pressure management are 120/70.  Please CONTINUE TO check your blood pressure ONCE A WEEK and send me the readings so I can determine if you need a change in medication

## 2016-12-02 NOTE — Progress Notes (Signed)
Subjective:  Patient ID: Alexandria Graves, female    DOB: Aug 17, 1950  Age: 66 y.o. MRN: [id number]  CC: There were no encounter diagnoses.  HPI Alexandria Graves presents for annual preventive visit and follow up  On obesity and hypertension .  Health maintenance/screenings reviewed:  Last PAP normal Nov 2017, [place], hyperplastic polyps Mammogram April 2017,  Ordered today   She refuses vaccines     Obesity: Went on a sugar binge from October to April and gained weight, byt has been following the KETO diet for 6 weeks and has lost 6 lbs.  She is exercising 4 days per week,  Weights  , less cardio  Hypertension:  Patient is taking her medications as prescribed and notes no adverse effects.  Home BP readings have been done about once per week and are  generally < 130/80 .  She is avoiding added salt in her diet and walking regularly about 3 times per week for exercise  .  Home readings 130/65.    Taking a mg liquid supplement and using a salt with lytes added  defers all vaccines   Fasting today    Had a fall in June hit her forehead on a doorframe.  Developed a large goosegg, mild headache and nausea lasted for for a few hours,  No vision changes,  Did not get checked out.     Lab Results  Component Value Date   HGBA1C 5.5 03/09/2016    Outpatient Medications Prior to Visit  Medication Sig Dispense Refill  . cetirizine (ZYRTEC) 10 MG tablet Take 10 mg by mouth daily.    . hydrochlorothiazide (HYDRODIURIL) 25 MG tablet TAKE 1 TABLET DAILY 90 tablet 2  . propranolol (INDERAL) 20 MG tablet TAKE 1 TABLET DAILY 90 tablet 3  . aspirin 81 MG tablet Take 81 mg by mouth daily. Reported on 08/22/2015    . Biotin 1000 MCG tablet Take 10,000 mcg by mouth 3 (three) times daily. Reported on 08/22/2015    . fish oil-omega-3 fatty acids 1000 MG capsule Take 1 g by mouth daily. Reported on 08/22/2015    . furosemide (LASIX) 20 MG tablet TAKE AS NEEDED FOR  FLUID   RETENTION (Patient not taking: Reported on 12/02/2016) 30 tablet 1  . hydrochlorothiazide (HYDRODIURIL) 25 MG tablet TAKE 1 TABLET DAILY (Patient not taking: Reported on 12/02/2016) 90 tablet 2  . saccharomyces boulardii (FLORASTOR) 250 MG capsule Take 250 mg by mouth 2 (two) times daily. Reported on 08/22/2015    . vitamin C (ASCORBIC ACID) 500 MG tablet Take 500 mg by mouth daily. Reported on 08/22/2015     No facility-administered medications prior to visit.     Review of Systems;  Patient denies headache, fevers, malaise, unintentional weight loss, skin rash, eye pain, sinus congestion and sinus pain, sore throat, dysphagia,  hemoptysis , cough, dyspnea, wheezing, chest pain, palpitations, orthopnea, edema, abdominal pain, nausea, melena, diarrhea, constipation, flank pain, dysuria, hematuria, urinary  Frequency, nocturia, numbness, tingling, seizures,  Focal weakness, Loss of consciousness,  Tremor, insomnia, depression, anxiety, and suicidal ideation.      Objective:  BP 132/82 (BP Location: Left Arm, Patient Position: Sitting, Cuff Size: Large)   Pulse (!) 58   Temp 98.2 F (36.8 C) (Oral)   Resp 14   Ht 5' 1.5" (1.562 m)   Wt 161 lb 6.4 oz (73.2 kg)   SpO2 98%   BMI 30.00 kg/m   BP Readings  from Last 3 Encounters:  12/02/16 132/82  03/09/16 138/78  08/22/15 (!) 148/80    Wt Readings from Last 3 Encounters:  12/02/16 161 lb 6.4 oz (73.2 kg)  03/09/16 157 lb 8 oz (71.4 kg)  08/22/15 162 lb 12 oz (73.8 kg)    General appearance: alert, cooperative and appears stated age Ears: normal TM's and external ear canals both ears Throat: lips, mucosa, and tongue normal; teeth and gums normal Neck: no adenopathy, no carotid bruit, supple, symmetrical, trachea midline and thyroid not enlarged, symmetric, no tenderness/mass/nodules Back: symmetric, no curvature. ROM normal. No CVA tenderness. Lungs: clear to auscultation bilaterally Heart: regular rate and rhythm, S1, S2  normal, no murmur, click, rub or gallop Abdomen: soft, non-tender; bowel sounds normal; no masses,  no organomegaly Pulses: 2+ and symmetric Skin: Skin color, texture, turgor normal. No rashes or lesions Lymph nodes: Cervical, supraclavicular, and axillary nodes normal.  Lab Results  Component Value Date   HGBA1C 5.5 03/09/2016   HGBA1C 5.7 08/22/2015   HGBA1C 5.7 07/26/2014    Lab Results  Component Value Date   CREATININE 0.88 03/09/2016   CREATININE 0.79 08/22/2015   CREATININE 0.77 01/28/2015    Lab Results  Component Value Date   WBC 6.8 01/25/2014   HGB 14.9 01/25/2014   HCT 43.8 01/25/2014   PLT 298.0 01/25/2014   GLUCOSE 98 03/09/2016   CHOL 191 03/09/2016   TRIG 87.0 03/09/2016   HDL 51.50 03/09/2016   LDLDIRECT 113.0 08/22/2015   LDLCALC 122 (H) 03/09/2016   ALT 15 03/09/2016   AST 17 03/09/2016   NA 138 03/09/2016   K 3.6 03/09/2016   CL 98 03/09/2016   CREATININE 0.88 03/09/2016   BUN 22 03/09/2016   CO2 34 (H) 03/09/2016   TSH 1.05 08/22/2015   HGBA1C 5.5 03/09/2016   MICROALBUR 1.4 08/22/2015    Mm Screening Breast Tomo Bilateral  Result Date: 08/29/2015 CLINICAL DATA:  Screening. EXAM: 2D DIGITAL SCREENING BILATERAL MAMMOGRAM WITH CAD AND ADJUNCT TOMO COMPARISON:  Previous exam(s). ACR Breast Density Category c: The breast tissue is heterogeneously dense, which may obscure small masses. FINDINGS: There are no findings suspicious for malignancy. Images were processed with CAD. IMPRESSION: No mammographic evidence of malignancy. A result letter of this screening mammogram will be mailed directly to the patient. RECOMMENDATION: Screening mammogram in one year. (Code:SM-B-01Y) BI-RADS CATEGORY  1: Negative. Electronically Signed   By: Pamelia Hoit M.D.   On: 08/29/2015 17:44    Assessment & Plan:   Problem List Items Addressed This Visit    None      I have discontinued Ms. Wierman's aspirin, fish oil-omega-3 fatty acids, Biotin, vitamin C,  saccharomyces boulardii, and furosemide. I am also having her maintain her cetirizine, hydrochlorothiazide, propranolol, multivitamin, and magnesium oxide.  Meds ordered this encounter  Medications  . Multiple Vitamin (MULTIVITAMIN) tablet    Sig: Take 1 tablet by mouth daily.  . magnesium oxide (MAG-OX) 400 MG tablet    Sig: Take 400 mg by mouth daily.    Medications Discontinued During This Encounter  Medication Reason  . aspirin 81 MG tablet Patient has not taken in last 30 days  . Biotin 1000 MCG tablet Patient has not taken in last 30 days  . fish oil-omega-3 fatty acids 1000 MG capsule Patient has not taken in last 30 days  . furosemide (LASIX) 20 MG tablet Patient has not taken in last 30 days  . hydrochlorothiazide (HYDRODIURIL) 25 MG tablet Duplicate  .  saccharomyces boulardii (FLORASTOR) 250 MG capsule Patient has not taken in last 30 days  . vitamin C (ASCORBIC ACID) 500 MG tablet Patient has not taken in last 30 days    Follow-up: No Follow-up on file.   Crecencio Mc, MD

## 2016-12-05 ENCOUNTER — Encounter: Payer: Self-pay | Admitting: Internal Medicine

## 2016-12-05 NOTE — Assessment & Plan Note (Signed)
She has historically preferred to avoid statin therapy or precursors. Current 10 yr risk is 12% using the FRC. Statin therapy advised.    Lab Results  Component Value Date   CHOL 217 (H) 12/02/2016   HDL 56.50 12/02/2016   LDLCALC 144 (H) 12/02/2016   LDLDIRECT 113.0 08/22/2015   TRIG 83.0 12/02/2016   CHOLHDL 4 12/02/2016

## 2016-12-05 NOTE — Assessment & Plan Note (Signed)
Annual comprehensive preventive exam was done as well as an evaluation and management of chronic conditions .  During the course of the visit the patient was educated and counseled about appropriate screening and preventive services including :  diabetes screening, lipid analysis with projected  10 year  risk for CAD , nutrition counseling, breast, cervical and colorectal cancer screening, and recommended immunizations.  Printed recommendations for health maintenance screenings was given 

## 2016-12-05 NOTE — Assessment & Plan Note (Signed)
Managed with  low dose inderal

## 2016-12-05 NOTE — Assessment & Plan Note (Signed)
Her A1cs was well below the range diagnostic of Dm and prediabetes. . Continue exercise, wt loss and low glycemic index diet  Lab Results  Component Value Date   HGBA1C 5.4 12/02/2016

## 2016-12-05 NOTE — Assessment & Plan Note (Signed)
I have congratulated her in reduction of   BMI and encouraged  Continued weight loss with goal of 10% of body weigh over the next 6 months using a low glycemic index diet and regular exercise a minimum of 5 days per week.    

## 2016-12-05 NOTE — Assessment & Plan Note (Addendum)
Not at goal on current regimen. Energy level has improved with lower dose of inderal. Renal function stable, no changes today.  Patient has been asked to check bp at home and submit readings in 2 weeks.   Lab Results  Component Value Date   CREATININE 0.72 12/02/2016   Lab Results  Component Value Date   NA 138 12/02/2016   K 3.9 12/02/2016   CL 97 12/02/2016   CO2 32 12/02/2016

## 2016-12-09 ENCOUNTER — Encounter: Payer: Self-pay | Admitting: Internal Medicine

## 2016-12-30 ENCOUNTER — Ambulatory Visit: Payer: BLUE CROSS/BLUE SHIELD

## 2016-12-31 ENCOUNTER — Ambulatory Visit
Admission: RE | Admit: 2016-12-31 | Discharge: 2016-12-31 | Disposition: A | Discharge status: Home / Self Care | Payer: BLUE CROSS/BLUE SHIELD | Source: Ambulatory Visit | Attending: Internal Medicine | Admitting: Internal Medicine

## 2016-12-31 DIAGNOSIS — Z1231 Encounter for screening mammogram for malignant neoplasm of breast: Secondary | ICD-10-CM | POA: Insufficient documentation

## 2016-12-31 DIAGNOSIS — Z1239 Encounter for other screening for malignant neoplasm of breast: Secondary | ICD-10-CM

## 2017-06-04 ENCOUNTER — Ambulatory Visit: Payer: BLUE CROSS/BLUE SHIELD | Admitting: Internal Medicine

## 2017-08-25 ENCOUNTER — Ambulatory Visit: Payer: BLUE CROSS/BLUE SHIELD | Admitting: Internal Medicine

## 2017-09-15 ENCOUNTER — Encounter: Payer: Self-pay | Admitting: Internal Medicine

## 2017-09-15 ENCOUNTER — Ambulatory Visit (INDEPENDENT_AMBULATORY_CARE_PROVIDER_SITE_OTHER): Payer: Medicare Other | Admitting: Internal Medicine

## 2017-09-15 VITALS — BP 142/94 | HR 64 | Temp 98.1°F | Resp 14 | Ht 61.5 in | Wt 156.0 lb

## 2017-09-15 DIAGNOSIS — E781 Pure hyperglyceridemia: Secondary | ICD-10-CM

## 2017-09-15 DIAGNOSIS — R7301 Impaired fasting glucose: Secondary | ICD-10-CM

## 2017-09-15 DIAGNOSIS — Z0000 Encounter for general adult medical examination without abnormal findings: Secondary | ICD-10-CM | POA: Diagnosis not present

## 2017-09-15 DIAGNOSIS — I10 Essential (primary) hypertension: Secondary | ICD-10-CM | POA: Diagnosis not present

## 2017-09-15 DIAGNOSIS — E663 Overweight: Secondary | ICD-10-CM

## 2017-09-15 DIAGNOSIS — E785 Hyperlipidemia, unspecified: Secondary | ICD-10-CM

## 2017-09-15 DIAGNOSIS — Z780 Asymptomatic menopausal state: Secondary | ICD-10-CM

## 2017-09-15 NOTE — Patient Instructions (Signed)
Health Maintenance for Postmenopausal Women Menopause is a normal process in which your reproductive ability comes to an end. This process happens gradually over a span of months to years, usually between the ages of 22 and 9. Menopause is complete when you have missed 12 consecutive menstrual periods. It is important to talk with your health care provider about some of the most common conditions that affect postmenopausal women, such as heart disease, cancer, and bone loss (osteoporosis). Adopting a healthy lifestyle and getting preventive care can help to promote your health and wellness. Those actions can also lower your chances of developing some of these common conditions. What should I know about menopause? During menopause, you may experience a number of symptoms, such as:  Moderate-to-severe hot flashes.  Night sweats.  Decrease in sex drive.  Mood swings.  Headaches.  Tiredness.  Irritability.  Memory problems.  Insomnia.  Choosing to treat or not to treat menopausal changes is an individual decision that you make with your health care provider. What should I know about hormone replacement therapy and supplements? Hormone therapy products are effective for treating symptoms that are associated with menopause, such as hot flashes and night sweats. Hormone replacement carries certain risks, especially as you become older. If you are thinking about using estrogen or estrogen with progestin treatments, discuss the benefits and risks with your health care provider. What should I know about heart disease and stroke? Heart disease, heart attack, and stroke become more likely as you age. This may be due, in part, to the hormonal changes that your body experiences during menopause. These can affect how your body processes dietary fats, triglycerides, and cholesterol. Heart attack and stroke are both medical emergencies. There are many things that you can do to help prevent heart disease  and stroke:  Have your blood pressure checked at least every 1-2 years. High blood pressure causes heart disease and increases the risk of stroke.  If you are 53-22 years old, ask your health care provider if you should take aspirin to prevent a heart attack or a stroke.  Do not use any tobacco products, including cigarettes, chewing tobacco, or electronic cigarettes. If you need help quitting, ask your health care provider.  It is important to eat a healthy diet and maintain a healthy weight. ? Be sure to include plenty of vegetables, fruits, low-fat dairy products, and lean protein. ? Avoid eating foods that are high in solid fats, added sugars, or salt (sodium).  Get regular exercise. This is one of the most important things that you can do for your health. ? Try to exercise for at least 150 minutes each week. The type of exercise that you do should increase your heart rate and make you sweat. This is known as moderate-intensity exercise. ? Try to do strengthening exercises at least twice each week. Do these in addition to the moderate-intensity exercise.  Know your numbers.Ask your health care provider to check your cholesterol and your blood glucose. Continue to have your blood tested as directed by your health care provider.  What should I know about cancer screening? There are several types of cancer. Take the following steps to reduce your risk and to catch any cancer development as early as possible. Breast Cancer  Practice breast self-awareness. ? This means understanding how your breasts normally appear and feel. ? It also means doing regular breast self-exams. Let your health care provider know about any changes, no matter how small.  If you are 40  or older, have a clinician do a breast exam (clinical breast exam or CBE) every year. Depending on your age, family history, and medical history, it may be recommended that you also have a yearly breast X-ray (mammogram).  If you  have a family history of breast cancer, talk with your health care provider about genetic screening.  If you are at high risk for breast cancer, talk with your health care provider about having an MRI and a mammogram every year.  Breast cancer (BRCA) gene test is recommended for women who have family members with BRCA-related cancers. Results of the assessment will determine the need for genetic counseling and BRCA1 and for BRCA2 testing. BRCA-related cancers include these types: ? Breast. This occurs in males or females. ? Ovarian. ? Tubal. This may also be called fallopian tube cancer. ? Cancer of the abdominal or pelvic lining (peritoneal cancer). ? Prostate. ? Pancreatic.  Cervical, Uterine, and Ovarian Cancer Your health care provider may recommend that you be screened regularly for cancer of the pelvic organs. These include your ovaries, uterus, and vagina. This screening involves a pelvic exam, which includes checking for microscopic changes to the surface of your cervix (Pap test).  For women ages 21-65, health care providers may recommend a pelvic exam and a Pap test every three years. For women ages 79-65, they may recommend the Pap test and pelvic exam, combined with testing for human papilloma virus (HPV), every five years. Some types of HPV increase your risk of cervical cancer. Testing for HPV may also be done on women of any age who have unclear Pap test results.  Other health care providers may not recommend any screening for nonpregnant women who are considered low risk for pelvic cancer and have no symptoms. Ask your health care provider if a screening pelvic exam is right for you.  If you have had past treatment for cervical cancer or a condition that could lead to cancer, you need Pap tests and screening for cancer for at least 20 years after your treatment. If Pap tests have been discontinued for you, your risk factors (such as having a new sexual partner) need to be  reassessed to determine if you should start having screenings again. Some women have medical problems that increase the chance of getting cervical cancer. In these cases, your health care provider may recommend that you have screening and Pap tests more often.  If you have a family history of uterine cancer or ovarian cancer, talk with your health care provider about genetic screening.  If you have vaginal bleeding after reaching menopause, tell your health care provider.  There are currently no reliable tests available to screen for ovarian cancer.  Lung Cancer Lung cancer screening is recommended for adults 69-62 years old who are at high risk for lung cancer because of a history of smoking. A yearly low-dose CT scan of the lungs is recommended if you:  Currently smoke.  Have a history of at least 30 pack-years of smoking and you currently smoke or have quit within the past 15 years. A pack-year is smoking an average of one pack of cigarettes per day for one year.  Yearly screening should:  Continue until it has been 15 years since you quit.  Stop if you develop a health problem that would prevent you from having lung cancer treatment.  Colorectal Cancer  This type of cancer can be detected and can often be prevented.  Routine colorectal cancer screening usually begins at  age 42 and continues through age 45.  If you have risk factors for colon cancer, your health care provider may recommend that you be screened at an earlier age.  If you have a family history of colorectal cancer, talk with your health care provider about genetic screening.  Your health care provider may also recommend using home test kits to check for hidden blood in your stool.  A small camera at the end of a tube can be used to examine your colon directly (sigmoidoscopy or colonoscopy). This is done to check for the earliest forms of colorectal cancer.  Direct examination of the colon should be repeated every  5-10 years until age 71. However, if early forms of precancerous polyps or small growths are found or if you have a family history or genetic risk for colorectal cancer, you may need to be screened more often.  Skin Cancer  Check your skin from head to toe regularly.  Monitor any moles. Be sure to tell your health care provider: ? About any new moles or changes in moles, especially if there is a change in a mole's shape or color. ? If you have a mole that is larger than the size of a pencil eraser.  If any of your family members has a history of skin cancer, especially at a young age, talk with your health care provider about genetic screening.  Always use sunscreen. Apply sunscreen liberally and repeatedly throughout the day.  Whenever you are outside, protect yourself by wearing long sleeves, pants, a wide-brimmed hat, and sunglasses.  What should I know about osteoporosis? Osteoporosis is a condition in which bone destruction happens more quickly than new bone creation. After menopause, you may be at an increased risk for osteoporosis. To help prevent osteoporosis or the bone fractures that can happen because of osteoporosis, the following is recommended:  If you are 46-71 years old, get at least 1,000 mg of calcium and at least 600 mg of vitamin D per day.  If you are older than age 55 but younger than age 65, get at least 1,200 mg of calcium and at least 600 mg of vitamin D per day.  If you are older than age 54, get at least 1,200 mg of calcium and at least 800 mg of vitamin D per day.  Smoking and excessive alcohol intake increase the risk of osteoporosis. Eat foods that are rich in calcium and vitamin D, and do weight-bearing exercises several times each week as directed by your health care provider. What should I know about how menopause affects my mental health? Depression may occur at any age, but it is more common as you become older. Common symptoms of depression  include:  Low or sad mood.  Changes in sleep patterns.  Changes in appetite or eating patterns.  Feeling an overall lack of motivation or enjoyment of activities that you previously enjoyed.  Frequent crying spells.  Talk with your health care provider if you think that you are experiencing depression. What should I know about immunizations? It is important that you get and maintain your immunizations. These include:  Tetanus, diphtheria, and pertussis (Tdap) booster vaccine.  Influenza every year before the flu season begins.  Pneumonia vaccine.  Shingles vaccine.  Your health care provider may also recommend other immunizations. This information is not intended to replace advice given to you by your health care provider. Make sure you discuss any questions you have with your health care provider. Document Released: 06/26/2005  Document Revised: 11/22/2015 Document Reviewed: 02/05/2015 [employer] Interactive Patient Education  2018 [employer]  

## 2017-09-15 NOTE — Progress Notes (Signed)
Patient ID: Alexandria Graves, female    DOB: [birth year]  Age: 67 y.o. MRN: [id number]  The patient is here for  "Welcome  To  Medicare"  wellness examination and management of other chronic and acute problems.   Gyn by schermerhorn Nov 2017 Colonoscopy 2015 done at [facility]: hyperplastic polyps Mammogram normal August 2018  Influenza vaccine declined.  Prevnar declined     The risk factors are reflected in the social history.  The roster of all physicians providing medical care to patient - is listed in the Snapshot section of the chart.  Activities of daily living:  The patient is 100% independent in all ADLs: dressing, toileting, feeding as well as independent mobility  Home safety : The patient has smoke detectors in the home. They wear seatbelts.  There are no firearms at home. There is no violence in the home.   There is no risks for hepatitis, STDs or HIV. There is no   history of blood transfusion. They have no travel history to infectious disease endemic areas of the world.  The patient has seenher dentist in the last six month. She has seen her eye doctor in the last year. She denies hearing  difficulty with regard to whispered voices and some television programs.  They have deferred audiologic testing in the last year.  They do not  have excessive sun exposure. Discussed the need for sun protection: hats, long sleeves and use of sunscreen if there is significant sun exposure.   Diet: the importance of a healthy diet is discussed. She is following a carb restricted low GI diet .  The benefits of regular aerobic exercise were discussed. She walks 4 times per week ,  20 minutes.   Depression screen: there are no signs or vegative symptoms of depression- irritability, change in appetite, anhedonia, sadness/tearfullness.  Cognitive assessment: the patient manages all their financial and personal affairs and is actively engaged. They could relate day,date,year and events; recalled  2/3 objects at 3 minutes; performed clock-face test normally.  The following portions of the patient's history were reviewed and updated as appropriate: allergies, current medications, past family history, past medical history,  past surgical history, past social history  and problem list.  Visual acuity was not assessed per patient preference since she has regular follow up with her ophthalmologist. Hearing and body mass index were assessed and reviewed.   During the course of the visit the patient was educated and counseled about appropriate screening and preventive services including : fall prevention , diabetes screening, nutrition counseling, colorectal cancer screening, and recommended immunizations.    During the course of the visit , End of Life objectives were discussed at length,  Patient does not have a living will in place or a healthcare power of attorney.  She was given printed information about advance directives and encouraged to return after discussing with her family,    CC: The primary encounter diagnosis was Postmenopausal estrogen deficiency. Diagnoses of Essential hypertension, Hyperlipidemia LDL goal <100, Impaired fasting glucose, Pure hyperglyceridemia, Overweight (BMI 25.0-29.9), and Wellness examination were also pertinent to this visit.    Hypertension:  She is no longer taking propranolol . Checks bp at home.  Readings have ranged from high of 160/70 and the low of 125/69.  Average 132/75 does not want  To add bp medications . Still taking hctz.   Had  2 hr post prandial of 153. Has eliminated sugar from diet and has  lost 5 lbs. Checks  ketones ,  Currently doing a 40 hour fast .  Exercising an average of 3 to 4 days per week .      History Alexandria Graves has a past medical history of History of migraine headaches, Hyperlipidemia, and Hypertension.   She has a past surgical history that includes Cholecystectomy (1984).   Her family history includes Cancer (age of onset:  22) in her father; Diabetes in her father and paternal aunt; Mental illness in her mother.She reports that she quit smoking about 46 years ago. She has never used smokeless tobacco. She reports that she drinks alcohol. She reports that she does not use drugs.  Outpatient Medications Prior to Visit  Medication Sig Dispense Refill  . cetirizine (ZYRTEC) 10 MG tablet Take 10 mg by mouth daily.    . hydrochlorothiazide (HYDRODIURIL) 25 MG tablet TAKE 1 TABLET DAILY 90 tablet 2  . magnesium oxide (MAG-OX) 400 MG tablet Take 400 mg by mouth daily.    . Multiple Vitamin (MULTIVITAMIN) tablet Take 1 tablet by mouth daily.    . propranolol (INDERAL) 20 MG tablet TAKE 1 TABLET DAILY (Patient not taking: Reported on 09/15/2017) 90 tablet 3   No facility-administered medications prior to visit.     Review of Systems   Patient denies headache, fevers, malaise, unintentional weight loss, skin rash, eye pain, sinus congestion and sinus pain, sore throat, dysphagia,  hemoptysis , cough, dyspnea, wheezing, chest pain, palpitations, orthopnea, edema, abdominal pain, nausea, melena, diarrhea, constipation, flank pain, dysuria, hematuria, urinary  Frequency, nocturia, numbness, tingling, seizures,  Focal weakness, Loss of consciousness,  Tremor, insomnia, depression, anxiety, and suicidal ideation.     Objective:  BP (!) 142/94 (BP Location: Left Arm, Patient Position: Sitting, Cuff Size: Normal)   Pulse 64   Temp 98.1 F (36.7 C) (Oral)   Resp 14   Ht 5' 1.5" (1.562 m)   Wt 156 lb (70.8 kg)   SpO2 99%   BMI 29.00 kg/m   Physical Exam   General appearance: alert, cooperative and appears stated age Head: Normocephalic, without obvious abnormality, atraumatic Eyes: conjunctivae/corneas clear. PERRL, EOM's intact. Fundi benign. Ears: normal TM's and external ear canals both ears Nose: Nares normal. Septum midline. Mucosa normal. No drainage or sinus tenderness. Throat: lips, mucosa, and tongue normal;  teeth and gums normal Neck: no adenopathy, no carotid bruit, no JVD, supple, symmetrical, trachea midline and thyroid not enlarged, symmetric, no tenderness/mass/nodules Lungs: clear to auscultation bilaterally Breasts: normal appearance, no masses or tenderness Heart: regular rate and rhythm, S1, S2 normal, no murmur, click, rub or gallop Abdomen: soft, non-tender; bowel sounds normal; no masses,  no organomegaly Extremities: extremities normal, atraumatic, no cyanosis or edema Pulses: 2+ and symmetric Skin: Skin color, texture, turgor normal. No rashes or lesions Neurologic: Alert and oriented X 3, normal strength and tone. Normal symmetric reflexes. Normal coordination and gait.     Assessment & Plan:   Problem List Items Addressed This Visit    Wellness examination    Annual comprehensive preventive exam was done as well as an evaluation and management of chronic conditions .  During the course of the visit the patient was educated and counseled about appropriate screening and preventive services including :  diabetes screening, lipid analysis with projected  10 year  risk for CAD , nutrition counseling, breast, cervical and colorectal cancer screening, and recommended immunizations.  Printed recommendations for health maintenance screenings was given  Baseline EKG was done today and was interpreted by me as  normal sinus rhythm,  Normal axis /intervals.       Overweight (BMI 25.0-29.9)    I have congratulated her in reduction of   BMI and encouraged  Continued weight loss with goal of 10% of body weight over the next 6 months using a low glycemic index diet and regular exercise a minimum of 5 days per week.        Impaired fasting glucose    Reported at home using husband's glucometer.  A1c is normal.   Lab Results  Component Value Date   HGBA1C 5.4 09/15/2017         Relevant Orders   Hemoglobin A1c (Completed)   Hypertension    Not well controlled since stopping  propranolol.  Still taking hctz.  She declines further treatment with additonal medications.   Lab Results  Component Value Date   CREATININE 0.77 09/15/2017   Lab Results  Component Value Date   NA 138 09/15/2017   K 4.0 09/15/2017   CL 98 09/15/2017   CO2 30 09/15/2017         Relevant Orders   Comprehensive metabolic panel (Completed)   EKG 12-Lead (Completed)   Hyperlipidemia    She continues to defer  statin therapy or precursors. Current 10 yr risk is 15% using the FRC. Statin therapy advised.    Lab Results  Component Value Date   CHOL 232 (H) 09/15/2017   HDL 65.20 09/15/2017   LDLCALC 153 (H) 09/15/2017   LDLDIRECT 113.0 08/22/2015   TRIG 68.0 09/15/2017   CHOLHDL 4 09/15/2017          Other Visit Diagnoses    Postmenopausal estrogen deficiency    -  Primary   Relevant Orders   DG Bone Density   Lipid panel (Completed)   Hyperlipidemia LDL goal <100       Relevant Orders   Lipid panel (Completed)   TSH (Completed)   Ambulatory referral to Cardiology      I have discontinued Marline D. Turrubiates's propranolol and multivitamin. I am also having her maintain her cetirizine, hydrochlorothiazide, and magnesium oxide.  No orders of the defined types were placed in this encounter.   Medications Discontinued During This Encounter  Medication Reason  . Multiple Vitamin (MULTIVITAMIN) tablet Patient Preference  . propranolol (INDERAL) 20 MG tablet Patient has not taken in last 30 days    Follow-up: No follow-ups on file.   Crecencio Mc, MD

## 2017-09-16 LAB — COMPREHENSIVE METABOLIC PANEL
ALBUMIN: 4.4 g/dL (ref 3.5–5.2)
ALK PHOS: 40 U/L (ref 39–117)
ALT: 17 U/L (ref 0–35)
AST: 20 U/L (ref 0–37)
BUN: 14 mg/dL (ref 6–23)
CALCIUM: 9.8 mg/dL (ref 8.4–10.5)
CHLORIDE: 98 meq/L (ref 96–112)
CO2: 30 mEq/L (ref 19–32)
Creatinine, Ser: 0.77 mg/dL (ref 0.40–1.20)
GFR: 79.94 mL/min (ref >60.00)
Glucose, Bld: 74 mg/dL (ref 70–99)
POTASSIUM: 4 meq/L (ref 3.5–5.1)
SODIUM: 138 meq/L (ref 135–145)
TOTAL PROTEIN: 7.7 g/dL (ref 6.0–8.3)
Total Bilirubin: 0.6 mg/dL (ref 0.2–1.2)

## 2017-09-16 LAB — LIPID PANEL
CHOLESTEROL: 232 mg/dL — AB (ref 0–200)
HDL: 65.2 mg/dL (ref >39.00)
LDL CALC: 153 mg/dL — AB (ref 0–99)
NonHDL: 166.71
TRIGLYCERIDES: 68 mg/dL (ref 0.0–149.0)
Total CHOL/HDL Ratio: 4
VLDL: 13.6 mg/dL (ref 0.0–40.0)

## 2017-09-16 LAB — HEMOGLOBIN A1C: HEMOGLOBIN A1C: 5.4 % (ref 4.6–6.5)

## 2017-09-16 LAB — TSH: TSH: 0.86 u[IU]/mL (ref 0.35–4.50)

## 2017-09-16 NOTE — Assessment & Plan Note (Addendum)
Not well controlled since stopping propranolol.  Still taking hctz.  She declines further treatment with additonal medications.   Lab Results  Component Value Date   CREATININE 0.77 09/15/2017   Lab Results  Component Value Date   NA 138 09/15/2017   K 4.0 09/15/2017   CL 98 09/15/2017   CO2 30 09/15/2017

## 2017-09-16 NOTE — Assessment & Plan Note (Signed)
I have congratulated her in reduction of   BMI and encouraged  Continued weight loss with goal of 10% of body weight over the next 6 months using a low glycemic index diet and regular exercise a minimum of 5 days per week.     

## 2017-09-16 NOTE — Assessment & Plan Note (Addendum)
Annual comprehensive preventive exam was done as well as an evaluation and management of chronic conditions .  During the course of the visit the patient was educated and counseled about appropriate screening and preventive services including :  diabetes screening, lipid analysis with projected  10 year  risk for CAD , nutrition counseling, breast, cervical and colorectal cancer screening, and recommended immunizations.  Printed recommendations for health maintenance screenings was given  Baseline EKG was done today and was interpreted by me as normal sinus rhythm,  Normal axis /intervals.

## 2017-09-16 NOTE — Assessment & Plan Note (Signed)
Reported at home using husband's glucometer.  A1c is normal.   Lab Results  Component Value Date   HGBA1C 5.4 09/15/2017

## 2017-09-16 NOTE — Assessment & Plan Note (Signed)
She continues to defer  statin therapy or precursors. Current 10 yr risk is 15% using the FRC. Statin therapy advised.    Lab Results  Component Value Date   CHOL 232 (H) 09/15/2017   HDL 65.20 09/15/2017   LDLCALC 153 (H) 09/15/2017   LDLDIRECT 113.0 08/22/2015   TRIG 68.0 09/15/2017   CHOLHDL 4 09/15/2017

## 2017-09-22 ENCOUNTER — Encounter: Payer: Self-pay | Admitting: Internal Medicine

## 2017-09-23 ENCOUNTER — Other Ambulatory Visit: Payer: Self-pay | Admitting: Internal Medicine

## 2017-09-23 DIAGNOSIS — Z8249 Family history of ischemic heart disease and other diseases of the circulatory system: Secondary | ICD-10-CM

## 2017-10-12 ENCOUNTER — Telehealth: Payer: Self-pay | Admitting: Internal Medicine

## 2017-10-12 NOTE — Telephone Encounter (Signed)
FYI

## 2017-10-12 NOTE — Telephone Encounter (Signed)
Copied from [place] [phone]. Topic: Quick Communication - See Telephone Encounter >> Oct 12, 2017 12:33 PM Antonieta Iba C wrote: CRM for notification. See Telephone encounter for: 10/12/17.  Pt says that she discussed having a CAC and a bone density test with provider. Pt would like to have bone density after 10/25/17 and also pt says that she received a call from cardiology about the CAC but because she's not a pt they wouldn't complete. Pt says that they advised her that she would need to go to a different location in [place].    Please assist further.

## 2017-10-12 NOTE — Telephone Encounter (Signed)
I viewed the patient's referral. The patient refused to schedule an appointment when cardiology called . Patient requested to have a CT calcium scoring to be ordered. I will forward note on to referral coordinator to schedule bone density.

## 2017-10-12 NOTE — Telephone Encounter (Signed)
Request for Hydrochlorothiazide refill in a 90 day supply. Last filled 05/04/16#90 with 2 refills  Last OV: 09/15/17 PCP: Dr. Derrel Nip Pharmacy:[facility]

## 2017-10-12 NOTE — Telephone Encounter (Signed)
Copied from [place] [id number]. Topic: Quick Communication - Rx Refill/Question >> Oct 12, 2017 12:31 PM Wynetta Emery, [place] C wrote: Medication: hydrochlorothiazide (HYDRODIURIL) 25 MG tablet - 90 day supply  Has the patient contacted their pharmacy?   (Agent: If no, request that the patient contact the pharmacy for the refill.)  (Agent: If yes, when and what did the pharmacy advise?)  Preferred Pharmacy (with phone number or street name): [facility] - [address], [place] [phone] (Phone) [phone] (Fax)      Agent: Please be advised that RX refills may take up to 3 business days. We ask that you follow-up with your pharmacy.

## 2017-10-13 ENCOUNTER — Other Ambulatory Visit: Payer: Self-pay

## 2017-10-13 ENCOUNTER — Other Ambulatory Visit: Payer: Self-pay | Admitting: Internal Medicine

## 2017-10-13 DIAGNOSIS — E7800 Pure hypercholesterolemia, unspecified: Secondary | ICD-10-CM

## 2017-10-13 DIAGNOSIS — I10 Essential (primary) hypertension: Secondary | ICD-10-CM

## 2017-10-13 DIAGNOSIS — R7301 Impaired fasting glucose: Secondary | ICD-10-CM

## 2017-10-13 MED ORDER — HYDROCHLOROTHIAZIDE 25 MG PO TABS: 25.0000 mg | ORAL_TABLET | Freq: Every day | ORAL | 2 refills | Status: DC

## 2017-10-13 NOTE — Telephone Encounter (Signed)
I have reordered the coronary CT separately since patient refused to see cardiology to have it done

## 2017-10-13 NOTE — Telephone Encounter (Signed)
Refill for HCTZ sent to patients [facility] pharmacy

## 2017-10-13 NOTE — Progress Notes (Signed)
Coronary CT calcium score ordered

## 2017-11-10 ENCOUNTER — Ambulatory Visit
Admission: RE | Admit: 2017-11-10 | Discharge: 2017-11-10 | Disposition: A | Discharge status: Home / Self Care | Payer: Medicare Other | Source: Ambulatory Visit | Attending: Internal Medicine | Admitting: Internal Medicine

## 2017-11-10 DIAGNOSIS — Z13820 Encounter for screening for osteoporosis: Secondary | ICD-10-CM | POA: Diagnosis not present

## 2017-11-10 DIAGNOSIS — Z780 Asymptomatic menopausal state: Secondary | ICD-10-CM

## 2018-03-25 ENCOUNTER — Ambulatory Visit: Payer: TRICARE For Life (TFL) | Admitting: Internal Medicine

## 2018-04-08 ENCOUNTER — Encounter: Payer: Self-pay | Admitting: Internal Medicine

## 2018-04-08 ENCOUNTER — Ambulatory Visit (INDEPENDENT_AMBULATORY_CARE_PROVIDER_SITE_OTHER): Payer: Medicare Other | Admitting: Internal Medicine

## 2018-04-08 VITALS — BP 140/88 | HR 65 | Temp 98.3°F | Resp 16 | Ht 61.5 in | Wt 156.0 lb

## 2018-04-08 DIAGNOSIS — R7301 Impaired fasting glucose: Secondary | ICD-10-CM | POA: Diagnosis not present

## 2018-04-08 DIAGNOSIS — I10 Essential (primary) hypertension: Secondary | ICD-10-CM | POA: Diagnosis not present

## 2018-04-08 DIAGNOSIS — K222 Esophageal obstruction: Secondary | ICD-10-CM

## 2018-04-08 DIAGNOSIS — E785 Hyperlipidemia, unspecified: Secondary | ICD-10-CM

## 2018-04-08 DIAGNOSIS — E663 Overweight: Secondary | ICD-10-CM

## 2018-04-08 DIAGNOSIS — E1169 Type 2 diabetes mellitus with other specified complication: Secondary | ICD-10-CM

## 2018-04-08 DIAGNOSIS — E782 Mixed hyperlipidemia: Secondary | ICD-10-CM

## 2018-04-08 DIAGNOSIS — Z1231 Encounter for screening mammogram for malignant neoplasm of breast: Secondary | ICD-10-CM | POA: Diagnosis not present

## 2018-04-08 LAB — COMPREHENSIVE METABOLIC PANEL
ALBUMIN: 4.5 g/dL (ref 3.5–5.2)
ALK PHOS: 42 U/L (ref 39–117)
ALT: 22 U/L (ref 0–35)
AST: 22 U/L (ref 0–37)
BUN: 16 mg/dL (ref 6–23)
CHLORIDE: 98 meq/L (ref 96–112)
CO2: 31 mEq/L (ref 19–32)
CREATININE: 0.73 mg/dL (ref 0.40–1.20)
Calcium: 9.9 mg/dL (ref 8.4–10.5)
GFR: 84.87 mL/min (ref >60.00)
GLUCOSE: 85 mg/dL (ref 70–99)
Potassium: 3.8 mEq/L (ref 3.5–5.1)
SODIUM: 138 meq/L (ref 135–145)
TOTAL PROTEIN: 7.9 g/dL (ref 6.0–8.3)
Total Bilirubin: 0.6 mg/dL (ref 0.2–1.2)

## 2018-04-08 LAB — MICROALBUMIN / CREATININE URINE RATIO
Creatinine,U: 63.7 mg/dL
Microalb Creat Ratio: 1.2 mg/g (ref 0.0–30.0)
Microalb, Ur: 0.8 mg/dL (ref 0.0–1.9)

## 2018-04-08 LAB — LIPID PANEL
CHOLESTEROL: 208 mg/dL — AB (ref 0–200)
HDL: 62.4 mg/dL (ref >39.00)
LDL Cholesterol: 131 mg/dL — ABNORMAL HIGH (ref 0–99)
NonHDL: 145.55
TRIGLYCERIDES: 75 mg/dL (ref 0.0–149.0)
Total CHOL/HDL Ratio: 3
VLDL: 15 mg/dL (ref 0.0–40.0)

## 2018-04-08 LAB — HEMOGLOBIN A1C: HEMOGLOBIN A1C: 5.5 % (ref 4.6–6.5)

## 2018-04-08 LAB — TSH: TSH: 0.55 u[IU]/mL (ref 0.35–4.50)

## 2018-04-08 NOTE — Progress Notes (Signed)
Subjective:  Patient ID: Alexandria Graves, female    DOB: 15-Jan-1951  Age: 67 y.o. MRN: [id number]  CC: The primary encounter diagnosis was Hyperlipidemia associated with type 2 diabetes mellitus ([place]). Diagnoses of Essential hypertension, Impaired fasting glucose, Encounter for screening mammogram for breast cancer, Lower esophageal ring ([place]), Moderate mixed hyperlipidemia not requiring statin therapy, and Overweight (BMI 25.0-29.9) were also pertinent to this visit.  HPI Alexandria Graves presents for follow up on impaired fasting glucose , hyperlipidemia ,  Obesity and hypertension. She feels generally well and has no complaints. She is fasting today. She has again  declined the  influenza and pneumonia vaccines that were  offered .   1) Hypertension: patient checks blood pressure twice weekly at home.  Readings have been for the most part < 140/80 at rest . Patient is following a reduced salt diet most days and is taking medications as prescribed  2) IPG/obesity:  She has maintained  BMI < 30 since May 2019 following KETO diet .  She had her eye exam one month ago.    No change  She is walking daily for exercise. She has mild hyperlipidemia but prefers to avoid statins unless there is strong evidence suggestive of  CAD.  Discussed the utility of a cardiac CT , which she prefers to have done without a cardiology consultation.    3) Results of recent DEXA scan discussed with patient.   She has normal T scores.  She is overdue for mammogram (August 2018)  Outpatient Medications Prior to Visit  Medication Sig Dispense Refill  . cetirizine (ZYRTEC) 10 MG tablet Take 10 mg by mouth daily.    . hydrochlorothiazide (HYDRODIURIL) 25 MG tablet Take 1 tablet (25 mg total) by mouth daily. 90 tablet 2  . magnesium oxide (MAG-OX) 400 MG tablet Take 400 mg by mouth daily.     No facility-administered medications prior to visit.     Review of Systems;  Patient denies headache, fevers, malaise,  unintentional weight loss, skin rash, eye pain, sinus congestion and sinus pain, sore throat, dysphagia,  hemoptysis , cough, dyspnea, wheezing, chest pain, palpitations, orthopnea, edema, abdominal pain, nausea, melena, diarrhea, constipation, flank pain, dysuria, hematuria, urinary  Frequency, nocturia, numbness, tingling, seizures,  Focal weakness, Loss of consciousness,  Tremor, insomnia, depression, anxiety, and suicidal ideation.      Objective:  BP 140/88 (BP Location: Left Arm, Patient Position: Sitting, Cuff Size: Normal)   Pulse 65   Temp 98.3 F (36.8 C) (Oral)   Resp 16   Ht 5' 1.5" (1.562 m)   Wt 156 lb (70.8 kg)   SpO2 98%   BMI 29.00 kg/m   BP Readings from Last 3 Encounters:  04/08/18 140/88  09/15/17 (!) 142/94  12/02/16 132/82    Wt Readings from Last 3 Encounters:  04/08/18 156 lb (70.8 kg)  09/15/17 156 lb (70.8 kg)  12/02/16 161 lb 6.4 oz (73.2 kg)    General appearance: alert, cooperative and appears stated age Ears: normal TM's and external ear canals both ears Throat: lips, mucosa, and tongue normal; teeth and gums normal Neck: no adenopathy, no carotid bruit, supple, symmetrical, trachea midline and thyroid not enlarged, symmetric, no tenderness/mass/nodules Back: symmetric, no curvature. ROM normal. No CVA tenderness. Lungs: clear to auscultation bilaterally Heart: regular rate and rhythm, S1, S2 normal, no murmur, click, rub or gallop Abdomen: soft, non-tender; bowel sounds normal; no masses,  no organomegaly Pulses: 2+ and symmetric Skin: Skin color, texture,  turgor normal. No rashes or lesions Lymph nodes: Cervical, supraclavicular, and axillary nodes normal.  Lab Results  Component Value Date   HGBA1C 5.5 04/08/2018   HGBA1C 5.4 09/15/2017   HGBA1C 5.4 12/02/2016    Lab Results  Component Value Date   CREATININE 0.73 04/08/2018   CREATININE 0.77 09/15/2017   CREATININE 0.72 12/02/2016    Lab Results  Component Value Date   WBC 6.8  01/25/2014   HGB 14.9 01/25/2014   HCT 43.8 01/25/2014   PLT 298.0 01/25/2014   GLUCOSE 85 04/08/2018   CHOL 208 (H) 04/08/2018   TRIG 75.0 04/08/2018   HDL 62.40 04/08/2018   LDLDIRECT 113.0 08/22/2015   LDLCALC 131 (H) 04/08/2018   ALT 22 04/08/2018   AST 22 04/08/2018   NA 138 04/08/2018   K 3.8 04/08/2018   CL 98 04/08/2018   CREATININE 0.73 04/08/2018   BUN 16 04/08/2018   CO2 31 04/08/2018   TSH 0.55 04/08/2018   HGBA1C 5.5 04/08/2018   MICROALBUR 0.8 04/08/2018    Dg Bone Density  Result Date: 11/10/2017 EXAM: DUAL X-RAY ABSORPTIOMETRY (DXA) FOR BONE MINERAL DENSITY IMPRESSION: Dear Dr Deborra Medina, Your patient Alexandria Graves completed a BMD test on 11/10/2017 using the [place] (analysis version: 14.10) manufactured by [employer]. The following summarizes the results of our evaluation. PATIENT BIOGRAPHICAL: Name: Alexandria Graves Patient ID: [id number] Birth Date: 23-Nov-1950 Height: 61.0 in. Gender: Female Exam Date: 11/10/2017 Weight: 154.8 lbs. Indications: Advanced Age, Caucasian, POSTmenopausal Fractures: Treatments: multivitamin ASSESSMENT: The BMD measured at Femur Neck Left is 0.968 g/cm2 with a T-score of -0.5. This patient is considered normal according to [facility]) criteria. The scan quality is good. Site Region Measured Measured WHO Young Adult BMD Date       Age      Classification T-score AP Spine L1-L4 11/10/2017 65.2 Normal 0.4 1.224 g/cm2 DualFemur Neck Left 11/10/2017 65.2 Normal -0.5 0.968 g/cm2 Left Forearm Radius 33% 11/10/2017 65.2 Normal -0.2 0.860 g/cm2 World Health Organization [facility]) criteria for post-menopausal, Caucasian Women: Normal:       T-score at or above -1 SD Osteopenia:   T-score between -1 and -2.5 SD Osteoporosis: T-score at or below -2.5 SD RECOMMENDATIONS: 1. All patients should optimize calcium and vitamin D intake. 2. Consider FDA-approved medical therapies in postmenopausal women and men aged 69  years and older, based on the following: a. A hip or vertebral (clinical or morphometric) fracture b. T-score < -2.5 at the femoral neck or spine after appropriate evaluation to exclude secondary causes c. Low bone mass (T-score between -1.0 and -2.5 at the femoral neck or spine) and a 10-year probability of a hip fracture > 3% or a 10-year probability of a major osteoporosis-related fracture > 20% based on the [place]-adapted WHO algorithm d. Clinician judgment and/or patient preferences may indicate treatment for people with 10-year fracture probabilities above or below these levels FOLLOW-UP: Patients with diagnosed cases of osteoporosis or at high risk for fracture should have regular bone mineral density tests. For patients eligible for Medicare, routine testing is allowed once every 2 years. The testing frequency can be increased to one year for patients who have rapidly progressing disease, those who are receiving or discontinuing medical therapy to restore bone mass, or have additional risk factors. I have reviewed this report, and agree with the above findings. [facility] Electronically Signed   By: Marijo Conception, M.D.   On: 11/10/2017 10:38  Assessment & Plan:   Problem List Items Addressed This Visit    Hyperlipidemia    She continues to defer  statin therapy or precursors. She has lowered her LDL with diet,  But her current 10 yr risk is  Still mildly elevated at 13% using the FRC. Cardiac CT ordered     Lab Results  Component Value Date   CHOL 208 (H) 04/08/2018   HDL 62.40 04/08/2018   LDLCALC 131 (H) 04/08/2018   LDLDIRECT 113.0 08/22/2015   TRIG 75.0 04/08/2018   CHOLHDL 3 04/08/2018         Hypertension    she reports compliance with medication regimen  but has an elevated reading today in office.  She is not using NSAIDs daily.  Discussed goal of 120/70  (130/80 for patients over 70)  to preserve renal function.  She has been asked to check her  BP  at home and   submit readings for evaluation. Renal function, electrolytes and screen for proteinuria are all normal .  Lab Results  Component Value Date   CREATININE 0.73 04/08/2018   Lab Results  Component Value Date   NA 138 04/08/2018   K 3.8 04/08/2018   CL 98 04/08/2018   CO2 31 04/08/2018   Lab Results  Component Value Date   MICROALBUR 0.8 04/08/2018         Relevant Orders   Comprehensive metabolic panel (Completed)   Impaired fasting glucose    She is following a low GI index diet and maintaining a BMI < 30..A1c is < 5.8  Lab Results  Component Value Date   HGBA1C 5.5 04/08/2018         Relevant Orders   Hemoglobin A1c (Completed)   Microalbumin / creatinine urine ratio (Completed)   Lower esophageal ring ([place])    She is no longer taking a PPI and has no symptoms of GERD or dysphagia       Overweight (BMI 25.0-29.9)    I have congratulated her in maintenance of BMI < 30 and encouraged  Continued weight loss with goal of 10% of body weight over the next 6 months using a low glycemic index diet and regular exercise a minimum of 5 days per week.         Other Visit Diagnoses    Hyperlipidemia associated with type 2 diabetes mellitus ([place])    -  Primary   Relevant Orders   CT CARDIAC SCORING   Lipid panel (Completed)   TSH (Completed)   Encounter for screening mammogram for breast cancer       Relevant Orders   MM 3D SCREEN BREAST BILATERAL     A total of 25 minutes of face to face time was spent with patient more than half of which was spent in counselling and coordination of care   I am having Azyah D. Feltz maintain her cetirizine, magnesium oxide, and hydrochlorothiazide.  No orders of the defined types were placed in this encounter.   There are no discontinued medications.  Follow-up: No follow-ups on file.   Crecencio Mc, MD

## 2018-04-08 NOTE — Patient Instructions (Addendum)
You are doing great!    Things you can do to keep your kidney function normal:  Keep bp 120/70 or less   Increase your water intake to 1.5 L daily to keep bladder flushed and promote good kidney health  Limit use of NSAIDS (aleve, motrin)      I have ordered  your cardiac CT

## 2018-04-10 NOTE — Assessment & Plan Note (Signed)
I have congratulated her in maintenance of BMI < 30 and encouraged  Continued weight loss with goal of 10% of body weight over the next 6 months using a low glycemic index diet and regular exercise a minimum of 5 days per week.

## 2018-04-10 NOTE — Assessment & Plan Note (Signed)
She continues to defer  statin therapy or precursors. She has lowered her LDL with diet,  But her current 10 yr risk is  Still mildly elevated at 13% using the FRC. Cardiac CT ordered     Lab Results  Component Value Date   CHOL 208 (H) 04/08/2018   HDL 62.40 04/08/2018   LDLCALC 131 (H) 04/08/2018   LDLDIRECT 113.0 08/22/2015   TRIG 75.0 04/08/2018   CHOLHDL 3 04/08/2018

## 2018-04-10 NOTE — Assessment & Plan Note (Signed)
She is no longer taking a PPI and has no symptoms of GERD or dysphagia

## 2018-04-10 NOTE — Assessment & Plan Note (Signed)
She is following a low GI index diet and maintaining a BMI < 30..A1c is < 5.8  Lab Results  Component Value Date   HGBA1C 5.5 04/08/2018

## 2018-04-10 NOTE — Assessment & Plan Note (Addendum)
she reports compliance with medication regimen  but has an elevated reading today in office.  She is not using NSAIDs daily.  Discussed goal of 120/70  (130/80 for patients over 70)  to preserve renal function.  She has been asked to check her  BP  at home and  submit readings for evaluation. Renal function, electrolytes and screen for proteinuria are all normal .  Lab Results  Component Value Date   CREATININE 0.73 04/08/2018   Lab Results  Component Value Date   NA 138 04/08/2018   K 3.8 04/08/2018   CL 98 04/08/2018   CO2 31 04/08/2018   Lab Results  Component Value Date   MICROALBUR 0.8 04/08/2018

## 2018-04-17 ENCOUNTER — Other Ambulatory Visit: Payer: Self-pay

## 2018-04-17 ENCOUNTER — Ambulatory Visit
Admission: EM | Admit: 2018-04-17 | Discharge: 2018-04-17 | Disposition: A | Discharge status: Home / Self Care | Payer: Medicare Other | Attending: Family Medicine | Admitting: Family Medicine

## 2018-04-17 DIAGNOSIS — J029 Acute pharyngitis, unspecified: Secondary | ICD-10-CM | POA: Insufficient documentation

## 2018-04-17 DIAGNOSIS — I10 Essential (primary) hypertension: Secondary | ICD-10-CM | POA: Diagnosis not present

## 2018-04-17 DIAGNOSIS — Z79899 Other long term (current) drug therapy: Secondary | ICD-10-CM | POA: Diagnosis not present

## 2018-04-17 DIAGNOSIS — E785 Hyperlipidemia, unspecified: Secondary | ICD-10-CM | POA: Diagnosis not present

## 2018-04-17 LAB — RAPID STREP SCREEN (MED CTR MEBANE ONLY): STREPTOCOCCUS, GROUP A SCREEN (DIRECT): NEGATIVE

## 2018-04-17 MED ORDER — AZITHROMYCIN 250 MG PO TABS: ORAL_TABLET | ORAL | 0 refills | Status: DC

## 2018-04-17 NOTE — ED Triage Notes (Signed)
Pt with sore throat pain x 10 days. Cold sx started a few days ago. Would like a strep test

## 2018-04-17 NOTE — Discharge Instructions (Signed)
Antibiotic as prescribed.  It covers strep and atypical bacteria.  Use the lidocaine as needed.  Take care  Dr. Lacinda Axon

## 2018-04-17 NOTE — ED Provider Notes (Signed)
[facility]    CSN: [id number] Arrival date & time: 04/17/18  9702  History   Chief Complaint Chief Complaint  Patient presents with  . Sore Throat   HPI  67 year old female presents with sore throat.  Patient reports a 10-day history of sore throat.  Patient thinks that she has strep throat.  No reported close sick contacts.  Patient states she has had some runny nose recently as well.  No medications tried.  She has used "gargles" without resolution.  No fever.  No other respiratory symptoms.  She seems to have painful swelling.  No other associated symptoms. No other complaints.   PMH, Surgical Hx, Family Hx, Social History reviewed and updated as below.  Past Medical History:  Diagnosis Date  . History of migraine headaches   . Hyperlipidemia   . Hypertension    Patient Active Problem List   Diagnosis Date Noted  . Skin neoplasm 03/09/2016  . Wellness examination 02/02/2015  . History of shingles 04/20/2013  . Edema 01/19/2013  . Impaired fasting glucose 01/19/2013  . Anxiety 03/11/2012  . Lower esophageal ring (Schatzki) 03/09/2012  . History of migraine headaches   . Hypertension   . Hyperlipidemia   . Overweight (BMI 25.0-29.9) 08/26/2011    Past Surgical History:  Procedure Laterality Date  . CHOLECYSTECTOMY  1984    OB History   None      Home Medications    Prior to Admission medications   Medication Sig Start Date End Date Taking? Authorizing Provider  azithromycin (ZITHROMAX) 250 MG tablet 2 tablets on day 1, then 1 tablet daily on days 2-5. 04/17/18   Coral Spikes, DO  cetirizine (ZYRTEC) 10 MG tablet Take 10 mg by mouth daily.    [provider]  hydrochlorothiazide (HYDRODIURIL) 25 MG tablet Take 1 tablet (25 mg total) by mouth daily. 10/13/17   Crecencio Mc, MD  magnesium oxide (MAG-OX) 400 MG tablet Take 400 mg by mouth daily.    [provider]    Family History Family History  Problem Relation Age of  Onset  . Mental illness Mother        bipolar disorder  . Diabetes Father   . Cancer Father 34       prostate Ca  . Diabetes Paternal Aunt   . Breast cancer Neg Hx     Social History Social History   Tobacco Use  . Smoking status: Former Smoker    Last attempt to quit: 08/26/1971    Years since quitting: 46.6  . Smokeless tobacco: Never Used  Substance Use Topics  . Alcohol use: Yes    Comment: social  . Drug use: No     Allergies   Patient has no known allergies.   Review of Systems Review of Systems  Constitutional: Negative for fever.  HENT: Positive for rhinorrhea and sore throat.    Physical Exam Triage Vital Signs ED Triage Vitals  Enc Vitals Group     BP 04/17/18 0846 (!) 156/107     Pulse Rate 04/17/18 0846 100     Resp 04/17/18 0846 16     Temp 04/17/18 0846 98.4 F (36.9 C)     Temp Source 04/17/18 0846 Oral     SpO2 04/17/18 0846 98 %     Weight 04/17/18 0847 152 lb (68.9 kg)     Height 04/17/18 0847 5' 1.5" (1.562 m)     Head Circumference --  Peak Flow --      Pain Score 04/17/18 0846 6     Pain Loc --      Pain Edu? --      Excl. in [place]? --    Updated Vital Signs BP (!) 156/107 (BP Location: Left Arm)   Pulse 100   Temp 98.4 F (36.9 C) (Oral)   Resp 16   Ht 5' 1.5" (1.562 m)   Wt 68.9 kg   SpO2 98%   BMI 28.26 kg/m   Visual Acuity Right Eye Distance:   Left Eye Distance:   Bilateral Distance:    Right Eye Near:   Left Eye Near:    Bilateral Near:     Physical Exam  Constitutional: She is oriented to person, place, and time. She appears well-developed. No distress.  HENT:  Head: Normocephalic and atraumatic.  Mouth/Throat: Uvula is midline. No uvula swelling. Posterior oropharyngeal erythema present. No oropharyngeal exudate.  Eyes: Conjunctivae are normal. Right eye exhibits no discharge. Left eye exhibits no discharge.  Cardiovascular: Normal rate and regular rhythm.  Pulmonary/Chest: Effort normal. No respiratory  distress.  Neurological: She is alert and oriented to person, place, and time.  Psychiatric: She has a normal mood and affect. Her behavior is normal.  Nursing note and vitals reviewed.  UC Treatments / Results  Labs (all labs ordered are listed, but only abnormal results are displayed) Labs Reviewed  RAPID STREP SCREEN ([facility] ONLY)  CULTURE, GROUP A STREP [facility])    EKG None  Radiology No results found.  Procedures Procedures (including critical care time)  Medications Ordered in UC Medications - No data to display  Initial Impression / Assessment and Plan / UC Course  I have reviewed the triage vital signs and the nursing notes.  Pertinent labs & imaging results that were available during my care of the patient were reviewed by me and considered in my medical decision making (see chart for details).    67 year old female presents with pharyngitis.  Strep negative.  I clinically do not suspect that she has strep pharyngitis either.  Awaiting culture.  Patient requesting antibiotic therapy given duration of illness.  Placing on azithromycin as it will cover strep as well as atypical bacteria.  Final Clinical Impressions(s) / UC Diagnoses   Final diagnoses:  Pharyngitis, unspecified etiology     Discharge Instructions     Antibiotic as prescribed.  It covers strep and atypical bacteria.  Use the lidocaine as needed.  Take care  Dr. Lacinda Axon     ED Prescriptions    Medication Sig Dispense Auth. Provider   azithromycin (ZITHROMAX) 250 MG tablet 2 tablets on day 1, then 1 tablet daily on days 2-5. 6 tablet Coral Spikes, DO     Controlled Substance Prescriptions [place] Controlled Substance Registry consulted? Not Applicable   Coral Spikes, [place] 04/17/18 1884

## 2018-04-20 LAB — CULTURE, GROUP A STREP (THRC)

## 2018-04-27 ENCOUNTER — Telehealth: Payer: TRICARE For Life (TFL) | Admitting: Nurse Practitioner

## 2018-04-27 DIAGNOSIS — R05 Cough: Secondary | ICD-10-CM

## 2018-04-27 DIAGNOSIS — R059 Cough, unspecified: Secondary | ICD-10-CM

## 2018-04-27 MED ORDER — BENZONATATE 100 MG PO CAPS: 100.0000 mg | ORAL_CAPSULE | Freq: Three times a day (TID) | ORAL | 0 refills | Status: DC | PRN

## 2018-04-27 NOTE — Progress Notes (Signed)

## 2018-05-09 ENCOUNTER — Telehealth: Payer: TRICARE For Life (TFL) | Admitting: Family Medicine

## 2018-05-09 DIAGNOSIS — R059 Cough, unspecified: Secondary | ICD-10-CM

## 2018-05-09 DIAGNOSIS — R0981 Nasal congestion: Secondary | ICD-10-CM

## 2018-05-09 DIAGNOSIS — R05 Cough: Secondary | ICD-10-CM

## 2018-05-09 MED ORDER — BENZONATATE 100 MG PO CAPS: 100.0000 mg | ORAL_CAPSULE | Freq: Three times a day (TID) | ORAL | 0 refills | Status: DC | PRN

## 2018-05-09 MED ORDER — AZELASTINE HCL 0.1 % NA SOLN: 1.0000 | Freq: Two times a day (BID) | NASAL | 0 refills | Status: DC

## 2018-05-09 NOTE — Progress Notes (Signed)
We are sorry that you are not feeling well.  Here is how we plan to help!  Based on your presentation I believe you most likely have A cough due to a virus.  This is called viral bronchitis and is best treated by rest, plenty of fluids and control of the cough.  You may use Ibuprofen or Tylenol as directed to help your symptoms.     In addition you may use A prescription cough medication called Tessalon Perles 100mg . You may take 1-2 capsules every 8 hours as needed for your cough.  While taking this medication you can also use over the counter options like Robitussin and Delsym. Be sure to increase your water intake. A cough can persist up to 21 days after an upper respiratory infection. Please seek care with your PCP if your symptoms do not improve.  From your responses in the eVisit questionnaire you describe inflammation in the upper respiratory tract which is causing a significant cough.  This is commonly called Bronchitis and has four common causes:    Allergies  Viral Infections  Acid Reflux  Bacterial Infection Allergies, viruses and acid reflux are treated by controlling symptoms or eliminating the cause. An example might be a cough caused by taking certain blood pressure medications. You stop the cough by changing the medication. Another example might be a cough caused by acid reflux. Controlling the reflux helps control the cough.  USE OF BRONCHODILATOR ("RESCUE") INHALERS: There is a risk from using your bronchodilator too frequently.  The risk is that over-reliance on a medication which only relaxes the muscles surrounding the breathing tubes can reduce the effectiveness of medications prescribed to reduce swelling and congestion of the tubes themselves.  Although you feel brief relief from the bronchodilator inhaler, your asthma may actually be worsening with the tubes becoming more swollen and filled with mucus.  This can delay other crucial treatments, such as oral steroid  medications. If you need to use a bronchodilator inhaler daily, several times per day, you should discuss this with your provider.  There are probably better treatments that could be used to keep your asthma under control.     HOME CARE . Only take medications as instructed by your medical team. . Complete the entire course of an antibiotic. . Drink plenty of fluids and get plenty of rest. . Avoid close contacts especially the very young and the elderly . Cover your mouth if you cough or cough into your sleeve. . Always remember to wash your hands . A steam or ultrasonic humidifier can help congestion.   GET HELP RIGHT AWAY IF: . You develop worsening fever. . You become short of breath . You cough up blood. . Your symptoms persist after you have completed your treatment plan MAKE SURE YOU   Understand these instructions.  Will watch your condition.  Will get help right away if you are not doing well or get worse.  Your e-visit answers were reviewed by a board certified advanced clinical practitioner to complete your personal care plan.  Depending on the condition, your plan could have included both over the counter or prescription medications. If there is a problem please reply  once you have received a response from your provider. Your safety is important to [place].  If you have drug allergies check your prescription carefully.    You can use MyChart to ask questions about today's visit, request a non-urgent call back, or ask for a work or school excuse for  24 hours related to this e-Visit. If it has been greater than 24 hours you will need to follow up with your provider, or enter a new e-Visit to address those concerns. You will get an e-mail in the next two days asking about your experience.  I hope that your e-visit has been valuable and will speed your recovery. Thank you for using e-visits.

## 2018-05-09 NOTE — Addendum Note (Signed)
Addended by: Shella Maxim on: 05/09/2018 11:10 AM   Modules accepted: Orders

## 2018-05-23 ENCOUNTER — Encounter: Payer: Self-pay | Admitting: Internal Medicine

## 2018-05-25 ENCOUNTER — Ambulatory Visit (INDEPENDENT_AMBULATORY_CARE_PROVIDER_SITE_OTHER)
Admission: RE | Admit: 2018-05-25 | Discharge: 2018-05-25 | Disposition: A | Discharge status: Home / Self Care | Payer: Self-pay | Source: Ambulatory Visit | Attending: Internal Medicine | Admitting: Internal Medicine

## 2018-05-25 DIAGNOSIS — E1169 Type 2 diabetes mellitus with other specified complication: Secondary | ICD-10-CM

## 2018-05-25 DIAGNOSIS — E785 Hyperlipidemia, unspecified: Secondary | ICD-10-CM

## 2018-06-28 ENCOUNTER — Ambulatory Visit
Admission: RE | Admit: 2018-06-28 | Discharge: 2018-06-28 | Disposition: A | Discharge status: Home / Self Care | Payer: Medicare Other | Source: Ambulatory Visit | Attending: Internal Medicine | Admitting: Internal Medicine

## 2018-06-28 DIAGNOSIS — Z1231 Encounter for screening mammogram for malignant neoplasm of breast: Secondary | ICD-10-CM | POA: Diagnosis not present

## 2018-07-19 ENCOUNTER — Other Ambulatory Visit: Payer: Self-pay

## 2018-07-19 MED ORDER — HYDROCHLOROTHIAZIDE 25 MG PO TABS: 25.0000 mg | ORAL_TABLET | Freq: Every day | ORAL | 1 refills | Status: DC

## 2018-07-20 ENCOUNTER — Ambulatory Visit (INDEPENDENT_AMBULATORY_CARE_PROVIDER_SITE_OTHER): Payer: Medicare Other | Admitting: Family Medicine

## 2018-07-20 ENCOUNTER — Encounter: Payer: Self-pay | Admitting: Family Medicine

## 2018-07-20 VITALS — BP 140/98 | HR 70 | Temp 98.6°F | Resp 16 | Ht 62.0 in | Wt 155.2 lb

## 2018-07-20 DIAGNOSIS — R300 Dysuria: Secondary | ICD-10-CM | POA: Diagnosis not present

## 2018-07-20 DIAGNOSIS — R1030 Lower abdominal pain, unspecified: Secondary | ICD-10-CM | POA: Diagnosis not present

## 2018-07-20 DIAGNOSIS — R109 Unspecified abdominal pain: Secondary | ICD-10-CM | POA: Diagnosis not present

## 2018-07-20 DIAGNOSIS — G8929 Other chronic pain: Secondary | ICD-10-CM | POA: Diagnosis not present

## 2018-07-20 DIAGNOSIS — R350 Frequency of micturition: Secondary | ICD-10-CM

## 2018-07-20 LAB — URINALYSIS, ROUTINE W REFLEX MICROSCOPIC
BILIRUBIN URINE: NEGATIVE
Hgb urine dipstick: NEGATIVE
KETONES UR: NEGATIVE
Nitrite: NEGATIVE
Specific Gravity, Urine: 1.015 (ref 1.000–1.030)
Total Protein, Urine: NEGATIVE
UROBILINOGEN UA: 0.2 (ref 0.0–1.0)
Urine Glucose: NEGATIVE
pH: 6.5 (ref 5.0–8.0)

## 2018-07-20 LAB — COMPREHENSIVE METABOLIC PANEL
ALT: 13 U/L (ref 0–35)
AST: 16 U/L (ref 0–37)
Albumin: 4.3 g/dL (ref 3.5–5.2)
Alkaline Phosphatase: 43 U/L (ref 39–117)
BUN: 12 mg/dL (ref 6–23)
CO2: 34 mEq/L — ABNORMAL HIGH (ref 19–32)
Calcium: 9.6 mg/dL (ref 8.4–10.5)
Chloride: 99 mEq/L (ref 96–112)
Creatinine, Ser: 0.75 mg/dL (ref 0.40–1.20)
GFR: 77.33 mL/min (ref >60.00)
Glucose, Bld: 91 mg/dL (ref 70–99)
Potassium: 3.2 mEq/L — ABNORMAL LOW (ref 3.5–5.1)
Sodium: 140 mEq/L (ref 135–145)
Total Bilirubin: 0.5 mg/dL (ref 0.2–1.2)
Total Protein: 7.6 g/dL (ref 6.0–8.3)

## 2018-07-20 LAB — CBC WITH DIFFERENTIAL/PLATELET
Basophils Absolute: 0.1 10*3/uL (ref 0.0–0.1)
Basophils Relative: 0.8 % (ref 0.0–3.0)
Eosinophils Absolute: 0.3 10*3/uL (ref 0.0–0.7)
Eosinophils Relative: 4.9 % (ref 0.0–5.0)
HEMATOCRIT: 41.5 % (ref 36.0–46.0)
Hemoglobin: 13.8 g/dL (ref 12.0–15.0)
LYMPHS PCT: 35 % (ref 12.0–46.0)
Lymphs Abs: 2.1 10*3/uL (ref 0.7–4.0)
MCHC: 33.2 g/dL (ref 30.0–36.0)
MCV: 82.7 fl (ref 78.0–100.0)
Monocytes Absolute: 0.5 10*3/uL (ref 0.1–1.0)
Monocytes Relative: 8.1 % (ref 3.0–12.0)
Neutro Abs: 3.1 10*3/uL (ref 1.4–7.7)
Neutrophils Relative %: 51.2 % (ref 43.0–77.0)
Platelets: 283 10*3/uL (ref 150.0–400.0)
RBC: 5.02 Mil/uL (ref 3.87–5.11)
RDW: 13.5 % (ref 11.5–15.5)
WBC: 6.1 10*3/uL (ref 4.0–10.5)

## 2018-07-20 LAB — POCT URINALYSIS DIPSTICK
BILIRUBIN UA: NEGATIVE
Blood, UA: NEGATIVE
Glucose, UA: NEGATIVE
Ketones, UA: NEGATIVE
Leukocytes, UA: NEGATIVE
Nitrite, UA: NEGATIVE
Protein, UA: NEGATIVE
Spec Grav, UA: 1.015 (ref 1.010–1.025)
Urobilinogen, UA: 0.2 E.U./dL
pH, UA: 7 (ref 5.0–8.0)

## 2018-07-20 MED ORDER — CEFDINIR 300 MG PO CAPS: 300.0000 mg | ORAL_CAPSULE | Freq: Two times a day (BID) | ORAL | 0 refills | Status: DC

## 2018-07-20 NOTE — Progress Notes (Signed)
Subjective:    Patient ID: Alexandria Graves, female    DOB: October 23, 1950, 68 y.o.   MRN: [id number]  HPI   Patient presents to clinic due to suspected UTI.  Patient states she has dealt with this sensation off and on for the past year.  States she will have some left flank pain, and suprapubic pain.  She also will notice increased urinary frequency and burning with urination.  These feelings wax and wane.  States she noticed them most recently on her trip back from [place].  States when in [place] she was drinking a large amounts of coffee, when her usual is 1 coffee a day.  Patient has never had a kidney stone, but states her son has.  Wonders if it is a kidney stone because her symptoms do seem similar to some of the ones her son had when he had a kidney stone.  Patient Active Problem List   Diagnosis Date Noted  . Skin neoplasm 03/09/2016  . Wellness examination 02/02/2015  . History of shingles 04/20/2013  . Edema 01/19/2013  . Impaired fasting glucose 01/19/2013  . Anxiety 03/11/2012  . Lower esophageal ring (Schatzki) 03/09/2012  . History of migraine headaches   . Hypertension   . Hyperlipidemia   . Overweight (BMI 25.0-29.9) 08/26/2011   Social History   Tobacco Use  . Smoking status: Former Smoker    Last attempt to quit: 08/26/1971    Years since quitting: 46.9  . Smokeless tobacco: Never Used  Substance Use Topics  . Alcohol use: Yes    Comment: social    Review of Systems  Constitutional: Negative for chills, fatigue and fever.  HENT: Negative for congestion, ear pain, sinus pain and sore throat.   Eyes: Negative.   Respiratory: Negative for cough, shortness of breath and wheezing.   Cardiovascular: Negative for chest pain, palpitations and leg swelling.  Gastrointestinal: Negative for abdominal pain, diarrhea, nausea and vomiting.  Genitourinary: +left flank pain, dysuria, frequency and urgency off and on for a year Musculoskeletal: Negative for arthralgias and  myalgias.  Skin: Negative for color change, pallor and rash.  Neurological: Negative for syncope, light-headedness and headaches.  Psychiatric/Behavioral: The patient is not nervous/anxious.       Objective:   Physical Exam Vitals signs and nursing note reviewed.  Constitutional:      General: She is not in acute distress.    Appearance: She is well-developed. She is not toxic-appearing.  HENT:     Head: Normocephalic and atraumatic.  Eyes:     General: No scleral icterus.    Extraocular Movements: Extraocular movements intact.     Conjunctiva/sclera: Conjunctivae normal.  Neck:     Musculoskeletal: Neck supple.     Trachea: No tracheal deviation.  Cardiovascular:     Rate and Rhythm: Normal rate and regular rhythm.     Heart sounds: Normal heart sounds.  Pulmonary:     Effort: Pulmonary effort is normal. No respiratory distress.     Breath sounds: Normal breath sounds.  Abdominal:     General: Bowel sounds are normal. There is no distension.     Palpations: Abdomen is soft. There is no mass.     Tenderness: There is abdominal tenderness (very mild suprapubic). There is no right CVA tenderness, left CVA tenderness, guarding or rebound.  Skin:    General: Skin is warm and dry.     Coloration: Skin is not jaundiced or pale.  Neurological:     Mental  Status: She is alert and oriented to person, place, and time.     Gait: Gait normal.  Psychiatric:        Mood and Affect: Mood normal.        Behavior: Behavior normal.    Vitals:   07/20/18 1049  BP: (!) 140/98  Pulse: 70  Resp: 16  Temp: 98.6 F (37 C)  SpO2: 97%      Assessment & Plan:   Urinary frequency, dysuria, left flank pain, lower abdominal pain - urinalysis unremarkable in clinic.  Patient strongly believes she has UTI.  States her urinary symptoms did improve when she took an antibiotic for a sinus/URI type infection back in November 2019.  Patient advised that sensation of dysuria could be related to other  things and not only UTI, such as vaginal dryness related to being postmenopausal. We will get blood work in clinic.  We will order CT renal stone to rule out kidney stones.  Patient also will take 5-day course of Omnicef to cover UTI.  Urine culture and urinalysis microscopic sent to lab for evaluation.  Patient aware someone will call her in regards to CT scan appointment.  We also discussed possibility of seeing urology if urinary symptoms persist.

## 2018-07-21 LAB — URINE CULTURE
MICRO NUMBER:: 275725
RESULT: NO GROWTH
SPECIMEN QUALITY:: ADEQUATE

## 2018-07-25 ENCOUNTER — Ambulatory Visit: Payer: Medicare Other

## 2018-09-16 ENCOUNTER — Ambulatory Visit: Payer: TRICARE For Life (TFL) | Admitting: Internal Medicine

## 2018-10-07 ENCOUNTER — Ambulatory Visit: Payer: TRICARE For Life (TFL) | Admitting: Internal Medicine

## 2018-12-15 ENCOUNTER — Other Ambulatory Visit: Payer: Self-pay

## 2019-03-21 ENCOUNTER — Other Ambulatory Visit: Payer: Self-pay

## 2019-03-21 ENCOUNTER — Ambulatory Visit (INDEPENDENT_AMBULATORY_CARE_PROVIDER_SITE_OTHER): Payer: Medicare Other | Admitting: Internal Medicine

## 2019-03-21 ENCOUNTER — Encounter: Payer: Self-pay | Admitting: Internal Medicine

## 2019-03-21 VITALS — BP 110/78 | HR 68 | Temp 97.1°F | Resp 14 | Ht 62.0 in | Wt 152.0 lb

## 2019-03-21 DIAGNOSIS — R7301 Impaired fasting glucose: Secondary | ICD-10-CM

## 2019-03-21 DIAGNOSIS — Z1231 Encounter for screening mammogram for malignant neoplasm of breast: Secondary | ICD-10-CM | POA: Diagnosis not present

## 2019-03-21 DIAGNOSIS — R5383 Other fatigue: Secondary | ICD-10-CM | POA: Diagnosis not present

## 2019-03-21 DIAGNOSIS — I10 Essential (primary) hypertension: Secondary | ICD-10-CM | POA: Diagnosis not present

## 2019-03-21 DIAGNOSIS — R109 Unspecified abdominal pain: Secondary | ICD-10-CM | POA: Diagnosis not present

## 2019-03-21 DIAGNOSIS — E663 Overweight: Secondary | ICD-10-CM | POA: Diagnosis not present

## 2019-03-21 DIAGNOSIS — D126 Benign neoplasm of colon, unspecified: Secondary | ICD-10-CM

## 2019-03-21 DIAGNOSIS — E782 Mixed hyperlipidemia: Secondary | ICD-10-CM

## 2019-03-21 LAB — URINALYSIS
Bilirubin Urine: NEGATIVE
Ketones, ur: 15 — AB
Leukocytes,Ua: NEGATIVE
Nitrite: NEGATIVE
Specific Gravity, Urine: 1.02 (ref 1.000–1.030)
Total Protein, Urine: NEGATIVE
Urine Glucose: NEGATIVE
Urobilinogen, UA: 0.2 (ref 0.0–1.0)
pH: 5.5 (ref 5.0–8.0)

## 2019-03-21 LAB — LIPID PANEL
Cholesterol: 218 mg/dL — ABNORMAL HIGH (ref 0–200)
HDL: 61.3 mg/dL (ref >39.00)
LDL Cholesterol: 141 mg/dL — ABNORMAL HIGH (ref 0–99)
NonHDL: 156.91
Total CHOL/HDL Ratio: 4
Triglycerides: 78 mg/dL (ref 0.0–149.0)
VLDL: 15.6 mg/dL (ref 0.0–40.0)

## 2019-03-21 LAB — COMPREHENSIVE METABOLIC PANEL
ALT: 13 U/L (ref 0–35)
AST: 14 U/L (ref 0–37)
Albumin: 4.5 g/dL (ref 3.5–5.2)
Alkaline Phosphatase: 37 U/L — ABNORMAL LOW (ref 39–117)
BUN: 15 mg/dL (ref 6–23)
CO2: 33 mEq/L — ABNORMAL HIGH (ref 19–32)
Calcium: 9.7 mg/dL (ref 8.4–10.5)
Chloride: 98 mEq/L (ref 96–112)
Creatinine, Ser: 0.58 mg/dL (ref 0.40–1.20)
GFR: 103.83 mL/min (ref >60.00)
Glucose, Bld: 82 mg/dL (ref 70–99)
Potassium: 4.1 mEq/L (ref 3.5–5.1)
Sodium: 139 mEq/L (ref 135–145)
Total Bilirubin: 0.6 mg/dL (ref 0.2–1.2)
Total Protein: 7.1 g/dL (ref 6.0–8.3)

## 2019-03-21 NOTE — Progress Notes (Signed)
Subjective:  Patient ID: Alexandria Graves, female    DOB: 1951/03/07  Age: 68 y.o. MRN: [id number]  CC: The primary encounter diagnosis was Fatigue, unspecified type. Diagnoses of Impaired fasting glucose, Flank pain, Moderate mixed hyperlipidemia not requiring statin therapy, Encounter for screening mammogram for malignant neoplasm of breast, Overweight (BMI 25.0-29.9), and Essential hypertension were also pertinent to this visit.  HPI Alexandria Graves presents for FOLLOW UP ON IPG,  Overweight, hypertension    Overweight:  Reached a nadir of 144  Lbs  But has gained some back since her diet changed during her husband's battle with  kidney cancer.  Her  husband is s/p nephrectomy for  A 9 x 9 cm tumor.   Had flank pain and urinary urgency /hesitancy after eating too many nutson the KETO diet.  ua was normal in March.  Renal stone  CT was ordered but deferred .  Symptoms sresolved after changing diet.   Husband is Seeing an integrative MD Carolynne Edouard and she is considering seeing her as well.   HTN:  Patient is taking her hctz irregularly,  For fluid retention,  and notes fewer leg and foot cramps .  Home BP readings have been done about once per week and are consistently < 120/0.  She is avoiding added salt in her diet and walking regularly about 5 times per week for exercise  .  Outpatient Medications Prior to Visit  Medication Sig Dispense Refill  . hydrochlorothiazide (HYDRODIURIL) 25 MG tablet Take 1 tablet (25 mg total) by mouth daily. 90 tablet 1  . loratadine (CLARITIN) 10 MG tablet Take 10 mg by mouth daily.    . magnesium oxide (MAG-OX) 400 MG tablet Take 400 mg by mouth daily.    Marland Kitchen azelastine (ASTELIN) 0.1 % nasal spray Place 1 spray into both nostrils 2 (two) times daily. Use in each nostril as directed (Patient not taking: Reported on 03/21/2019) 30 mL 0  . azithromycin (ZITHROMAX) 250 MG tablet 2 tablets on day 1, then 1 tablet daily on days 2-5. (Patient not taking: Reported on  03/21/2019) 6 tablet 0  . benzonatate (TESSALON) 100 MG capsule Take 1-2 capsules (100-200 mg total) by mouth 3 (three) times daily as needed for cough. (Patient not taking: Reported on 03/21/2019) 30 capsule 0  . cefdinir (OMNICEF) 300 MG capsule Take 1 capsule (300 mg total) by mouth 2 (two) times daily. (Patient not taking: Reported on 03/21/2019) 10 capsule 0  . cetirizine (ZYRTEC) 10 MG tablet Take 10 mg by mouth daily.     No facility-administered medications prior to visit.     Review of Systems;  Patient denies headache, fevers, malaise, unintentional weight loss, skin rash, eye pain, sinus congestion and sinus pain, sore throat, dysphagia,  hemoptysis , cough, dyspnea, wheezing, chest pain, palpitations, orthopnea, edema, abdominal pain, nausea, melena, diarrhea, constipation, flank pain, dysuria, hematuria, urinary  Frequency, nocturia, numbness, tingling, seizures,  Focal weakness, Loss of consciousness,  Tremor, insomnia, depression, anxiety, and suicidal ideation.      Objective:  BP 110/78 (BP Location: Left Arm, Patient Position: Sitting, Cuff Size: Normal)   Pulse 68   Temp (!) 97.1 F (36.2 C) (Temporal)   Resp 14   Ht 5\' 2"  (1.575 m)   Wt 152 lb (68.9 kg)   SpO2 98%   BMI 27.80 kg/m   BP Readings from Last 3 Encounters:  03/21/19 110/78  07/20/18 (!) 140/98  04/17/18 (!) 156/107    Wt Readings  from Last 3 Encounters:  03/21/19 152 lb (68.9 kg)  07/20/18 155 lb 3.2 oz (70.4 kg)  04/17/18 152 lb (68.9 kg)    General appearance: alert, cooperative and appears stated age Ears: normal TM's and external ear canals both ears Throat: lips, mucosa, and tongue normal; teeth and gums normal Neck: no adenopathy, no carotid bruit, supple, symmetrical, trachea midline and thyroid not enlarged, symmetric, no tenderness/mass/nodules Back: symmetric, no curvature. ROM normal. No CVA tenderness. Lungs: clear to auscultation bilaterally Heart: regular rate and rhythm, S1, S2  normal, no murmur, click, rub or gallop Abdomen: soft, non-tender; bowel sounds normal; no masses,  no organomegaly Pulses: 2+ and symmetric Skin: Skin color, texture, turgor normal. No rashes or lesions Lymph nodes: Cervical, supraclavicular, and axillary nodes normal.  Lab Results  Component Value Date   HGBA1C 5.5 04/08/2018   HGBA1C 5.4 09/15/2017   HGBA1C 5.4 12/02/2016    Lab Results  Component Value Date   CREATININE 0.75 07/20/2018   CREATININE 0.73 04/08/2018   CREATININE 0.77 09/15/2017    Lab Results  Component Value Date   WBC 6.1 07/20/2018   HGB 13.8 07/20/2018   HCT 41.5 07/20/2018   PLT 283.0 07/20/2018   GLUCOSE 91 07/20/2018   CHOL 208 (H) 04/08/2018   TRIG 75.0 04/08/2018   HDL 62.40 04/08/2018   LDLDIRECT 113.0 08/22/2015   LDLCALC 131 (H) 04/08/2018   ALT 13 07/20/2018   AST 16 07/20/2018   NA 140 07/20/2018   K 3.2 (L) 07/20/2018   CL 99 07/20/2018   CREATININE 0.75 07/20/2018   BUN 12 07/20/2018   CO2 34 (H) 07/20/2018   TSH 0.58 03/21/2019   HGBA1C 5.5 04/08/2018   MICROALBUR 0.8 04/08/2018    Mm 3d Screen Breast Bilateral  Result Date: 06/28/2018 CLINICAL DATA:  Screening. EXAM: DIGITAL SCREENING BILATERAL MAMMOGRAM WITH TOMO AND CAD COMPARISON:  Previous exam(s). ACR Breast Density Category b: There are scattered areas of fibroglandular density. FINDINGS: There are no findings suspicious for malignancy. Images were processed with CAD. IMPRESSION: No mammographic evidence of malignancy. A result letter of this screening mammogram will be mailed directly to the patient. RECOMMENDATION: Screening mammogram in one year. (Code:SM-B-01Y) BI-RADS CATEGORY  1: Negative. Electronically Signed   By: Fidela Salisbury M.D.   On: 06/28/2018 14:24    Assessment & Plan:   Problem List Items Addressed This Visit      Unprioritized   Overweight (BMI 25.0-29.9)    I have congratulated her in reduction of  BMI and encouraged  Continued adherence to a  low glycemic index diet and regular exercise a minimum of 5 days per week.        Hypertension    Resolved by history  She is using hctz prn edema.       Hyperlipidemia     She has lowered her LDL with diet,  Cardiac CT was done and her calcium score was zero. No therapy indicated     Lab Results  Component Value Date   CHOL 208 (H) 04/08/2018   HDL 62.40 04/08/2018   LDLCALC 131 (H) 04/08/2018   LDLDIRECT 113.0 08/22/2015   TRIG 75.0 04/08/2018   CHOLHDL 3 04/08/2018         Relevant Orders   Lipid panel   Impaired fasting glucose    She is following a low GI index diet and maintaining a BMI < 30..A1c is < 5.8  Lab Results  Component Value Date   HGBA1C  5.5 04/08/2018         Relevant Orders   Hemoglobin A1c   Comprehensive metabolic panel    Other Visit Diagnoses    Fatigue, unspecified type    -  Primary   Relevant Orders   Thyroid Panel With TSH (Completed)   Flank pain       Relevant Orders   Urinalysis   Encounter for screening mammogram for malignant neoplasm of breast       Relevant Orders   MM 3D SCREEN BREAST BILATERAL      I have discontinued Hailee D. Vandewater's cetirizine, azithromycin, benzonatate, azelastine, and cefdinir. I am also having her maintain her magnesium oxide, hydrochlorothiazide, and loratadine.  No orders of the defined types were placed in this encounter.   Medications Discontinued During This Encounter  Medication Reason  . azelastine (ASTELIN) 0.1 % nasal spray Completed Course  . azithromycin (ZITHROMAX) 250 MG tablet Completed Course  . benzonatate (TESSALON) 100 MG capsule Completed Course  . cefdinir (OMNICEF) 300 MG capsule Completed Course  . cetirizine (ZYRTEC) 10 MG tablet Patient has not taken in last 30 days  A total of 25 minutes of face to face time was spent with patient more than half of which was spent in counselling about the above mentioned conditions  and coordination of care   Follow-up: No follow-ups  on file.   Crecencio Mc, MD

## 2019-03-21 NOTE — Patient Instructions (Signed)
You are doing very well!  You do not need to take the hctz except as needed  Your annual mammogram has been ordered and is due after Jun 29 2019.  You are encouraged (required) to call to make your appointment at [facility]   Dietary Guidelines to Help Prevent Kidney Stones Kidney stones are deposits of minerals and salts that form inside your kidneys. Your risk of developing kidney stones may be greater depending on your diet, your lifestyle, the medicines you take, and whether you have certain medical conditions. Most people can reduce their chances of developing kidney stones by following the instructions below. Depending on your overall health and the type of kidney stones you tend to develop, your dietitian may give you more specific instructions. What are tips for following this plan? Reading food labels  Choose foods with "no salt added" or "low-salt" labels. Limit your sodium intake to less than 1500 mg per day.  Choose foods with calcium for each meal and snack. Try to eat about 300 mg of calcium at each meal. Foods that contain 200-500 mg of calcium per serving include: ? 8 oz (237 ml) of milk, fortified nondairy milk, and fortified fruit juice. ? 8 oz (237 ml) of kefir, yogurt, and soy yogurt. ? 4 oz (118 ml) of tofu. ? 1 oz of cheese. ? 1 cup (300 g) of dried figs. ? 1 cup (91 g) of cooked broccoli. ? 1-3 oz can of sardines or mackerel.  Most people need 1000 to 1500 mg of calcium each day. Talk to your dietitian about how much calcium is recommended for you. Shopping  Buy plenty of fresh fruits and vegetables. Most people do not need to avoid fruits and vegetables, even if they contain nutrients that may contribute to kidney stones.  When shopping for convenience foods, choose: ? Whole pieces of fruit. ? Premade salads with dressing on the side. ? Low-fat fruit and yogurt smoothies.  Avoid buying frozen meals or prepared deli foods.  Look for foods with live  cultures, such as yogurt and kefir. Cooking  Do not add salt to food when cooking. Place a salt shaker on the table and allow each person to add his or her own salt to taste.  Use vegetable protein, such as beans, textured vegetable protein (TVP), or tofu instead of meat in pasta, casseroles, and soups. Meal planning   Eat less salt, if told by your dietitian. To do this: ? Avoid eating processed or premade food. ? Avoid eating fast food.  Eat less animal protein, including cheese, meat, poultry, or fish, if told by your dietitian. To do this: ? Limit the number of times you have meat, poultry, fish, or cheese each week. Eat a diet free of meat at least 2 days a week. ? Eat only one serving each day of meat, poultry, fish, or seafood. ? When you prepare animal protein, cut pieces into small portion sizes. For most meat and fish, one serving is about the size of one deck of cards.  Eat at least 5 servings of fresh fruits and vegetables each day. To do this: ? Keep fruits and vegetables on hand for snacks. ? Eat 1 piece of fruit or a handful of berries with breakfast. ? Have a salad and fruit at lunch. ? Have two kinds of vegetables at dinner.  Limit foods that are high in a substance called oxalate. These include: ? Spinach. ? Rhubarb. ? Beets. ? Potato chips and  french fries. ? Nuts.  If you regularly take a diuretic medicine, make sure to eat at least 1-2 fruits or vegetables high in potassium each day. These include: ? Avocado. ? Banana. ? Orange, prune, carrot, or tomato juice. ? Baked potato. ? Cabbage. ? Beans and split peas. General instructions   Drink enough fluid to keep your urine clear or pale yellow. This is the most important thing you can do.  Talk to your health care provider and dietitian about taking daily supplements. Depending on your health and the cause of your kidney stones, you may be advised: ? Not to take supplements with vitamin C. ? To take a  calcium supplement. ? To take a daily probiotic supplement. ? To take other supplements such as magnesium, fish oil, or vitamin B6.  Take all medicines and supplements as told by your health care provider.  Limit alcohol intake to no more than 1 drink a day for nonpregnant women and 2 drinks a day for men. One drink equals 12 oz of beer, 5 oz of wine, or 1 oz of hard liquor.  Lose weight if told by your health care provider. Work with your dietitian to find strategies and an eating plan that works best for you. What foods are not recommended? Limit your intake of the following foods, or as told by your dietitian. Talk to your dietitian about specific foods you should avoid based on the type of kidney stones and your overall health. Grains Breads. Bagels. Rolls. Baked goods. Salted crackers. Cereal. Pasta. Vegetables Spinach. Rhubarb. Beets. Canned vegetables. Angie Fava. Olives. Meats and other protein foods Nuts. Nut butters. Large portions of meat, poultry, or fish. Salted or cured meats. Deli meats. Hot dogs. Sausages. Dairy Cheese. Beverages Regular soft drinks. Regular vegetable juice. Seasonings and other foods Seasoning blends with salt. Salad dressings. Canned soups. Soy sauce. Ketchup. Barbecue sauce. Canned pasta sauce. Casseroles. Pizza. Lasagna. Frozen meals. Potato chips. [place] fries. Summary  You can reduce your risk of kidney stones by making changes to your diet.  The most important thing you can do is drink enough fluid. You should drink enough fluid to keep your urine clear or pale yellow.  Ask your health care provider or dietitian how much protein from animal sources you should eat each day, and also how much salt and calcium you should have each day. This information is not intended to replace advice given to you by your health care provider. Make sure you discuss any questions you have with your health care provider. Document Released: 08/29/2010 Document Revised:  08/24/2018 Document Reviewed: 04/14/2016 Elsevier Patient Education  2020 [employer].

## 2019-03-22 LAB — THYROID PANEL WITH TSH
Free Thyroxine Index: 2.6 (ref 1.4–3.8)
T3 Uptake: 32 % (ref 22–35)
T4, Total: 8.2 ug/dL (ref 5.1–11.9)
TSH: 0.58 mIU/L (ref 0.40–4.50)

## 2019-03-22 LAB — HEMOGLOBIN A1C: Hgb A1c MFr Bld: 5.2 % (ref 4.6–6.5)

## 2019-03-22 NOTE — Assessment & Plan Note (Signed)
She is following a low GI index diet and maintaining a BMI < 30..A1c is < 5.8  Lab Results  Component Value Date   HGBA1C 5.5 04/08/2018

## 2019-03-22 NOTE — Assessment & Plan Note (Signed)
Resolved by history  She is using hctz prn edema.

## 2019-03-22 NOTE — Assessment & Plan Note (Signed)
She has lowered her LDL with diet,  Cardiac CT was done and her calcium score was zero. No therapy indicated     Lab Results  Component Value Date   CHOL 208 (H) 04/08/2018   HDL 62.40 04/08/2018   LDLCALC 131 (H) 04/08/2018   LDLDIRECT 113.0 08/22/2015   TRIG 75.0 04/08/2018   CHOLHDL 3 04/08/2018

## 2019-03-22 NOTE — Assessment & Plan Note (Signed)
I have congratulated her in reduction of  BMI and encouraged  Continued adherence to a low glycemic index diet and regular exercise a minimum of 5 days per week.

## 2019-05-31 ENCOUNTER — Telehealth: Payer: Self-pay | Admitting: Internal Medicine

## 2019-05-31 NOTE — Telephone Encounter (Signed)
Left message for patient to call back and schedule Medicare Annual Wellness Visit (AWV) either virtually/audio only.  WTM Exam 5.1.19; please schedule at anytime with Denisa O'Brien-Blaney at [facility] to schedule

## 2019-06-16 IMAGING — CT CT HEART SCORING
2 series · 16 of 20 positions shown, 18 images · non-contrast
Comparison: None.

Addendum:
EXAM:
OVER-READ INTERPRETATION  CT CHEST

The following report is an over-read performed by radiologist Dr.
Georges Patrick Motouom [REDACTED] on 05/25/2018. This over-read
does not include interpretation of cardiac or coronary anatomy or
pathology. The coronary calcium score interpretation by the
cardiologist is attached.
CLINICAL DATA: Risk stratification
Coronary Calcium Score
TECHNIQUE: The patient was scanned on a Siemens Somatom 64 slice scanner. Axial
non-contrast 3 mm slices were carried out through the heart. The
data set was analyzed on a dedicated work station and scored using
the Agatson method.

[Series 3: casc 3.0 i36f 2 bestdiast 67 % · axial · 0.33mm/px · z∈[-453,-378]mm · 8 of 33 slices shown, 10 images]
[im 4/33  vessel]
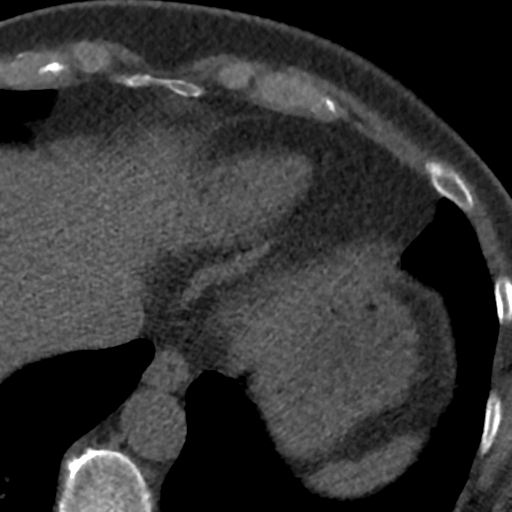
[im 4/33  lung]
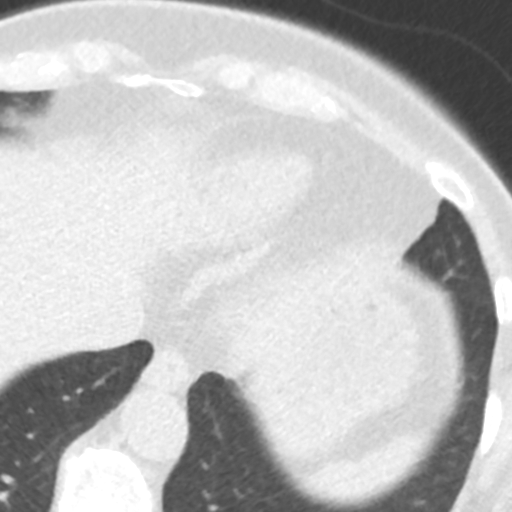
[im 8/33  vessel]
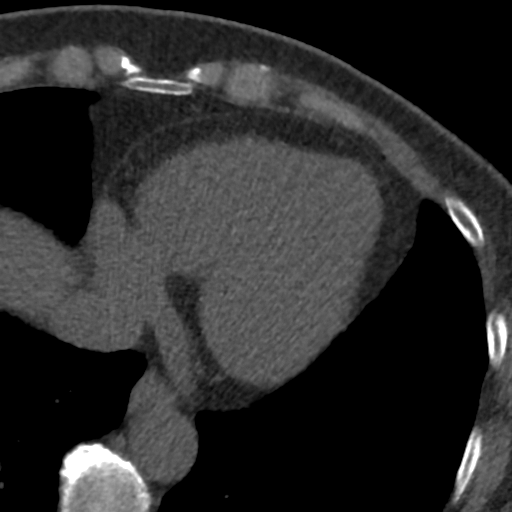
[im 11/33  vessel]
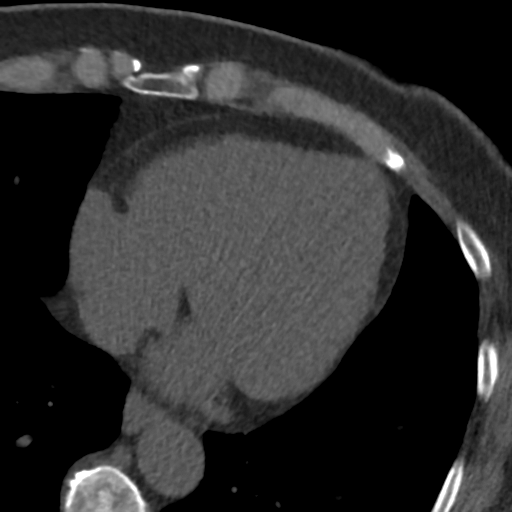
[im 15/33  vessel]
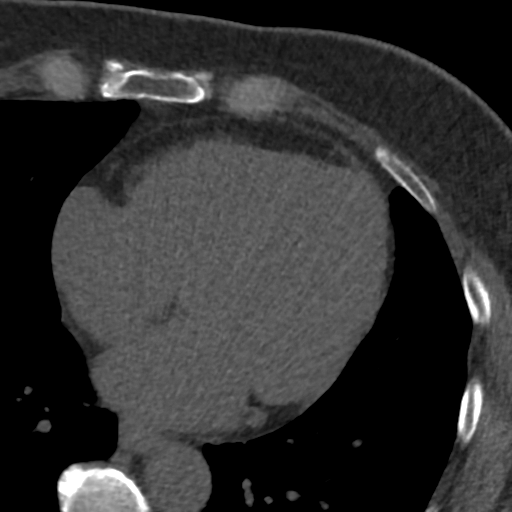
[im 18/33  vessel]
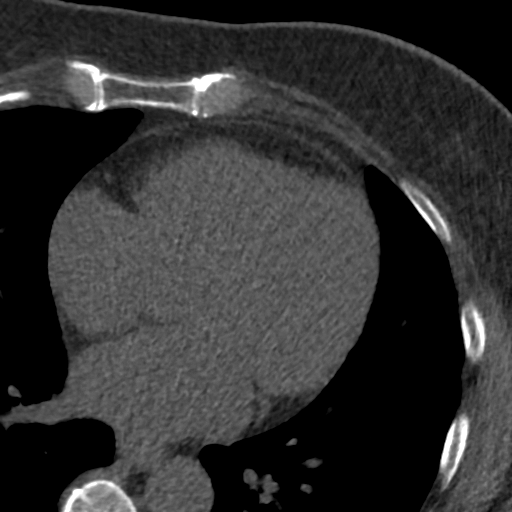
[im 18/33  lung]
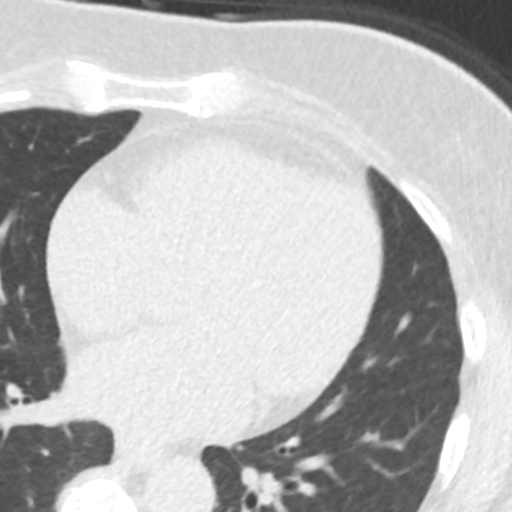
[im 22/33  vessel]
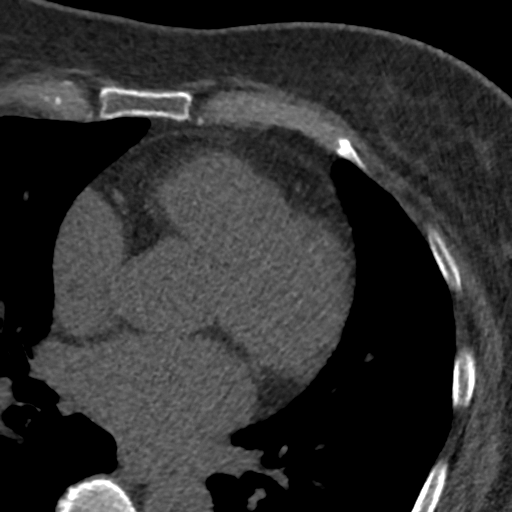
[im 25/33  vessel]
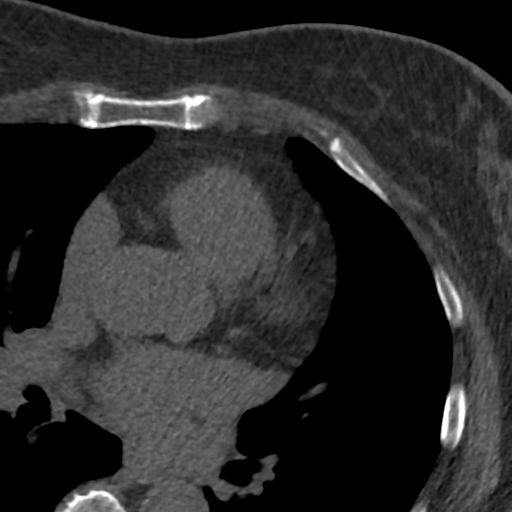
[im 29/33  vessel]
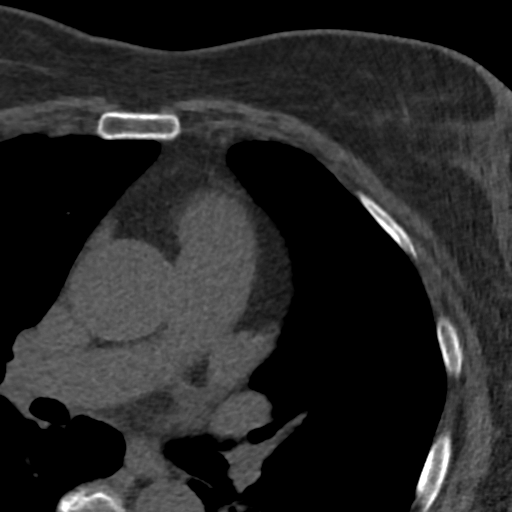

[Series 5: lung st 71 % · axial · 0.55mm/px · z∈[-453,-378]mm · 8 of 33 slices shown]
[im 4/33  lung]
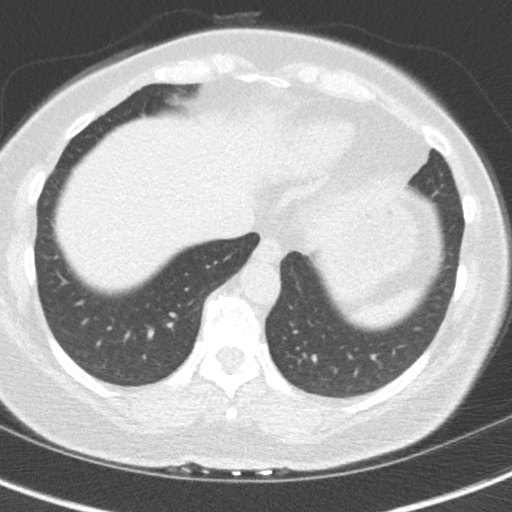
[im 8/33  lung]
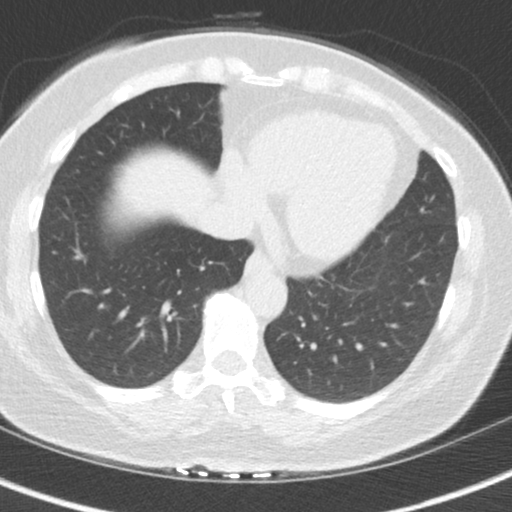
[im 11/33  lung]
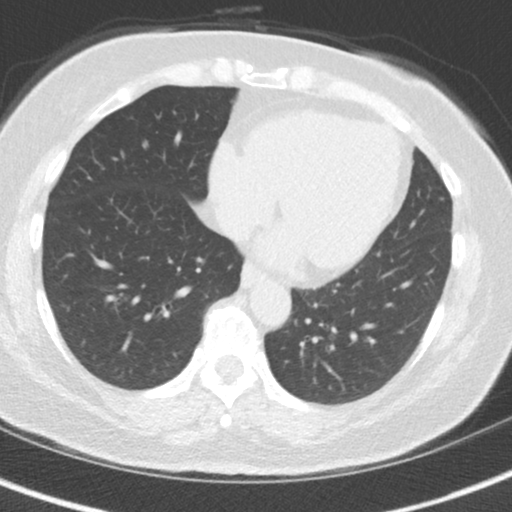
[im 15/33  lung]
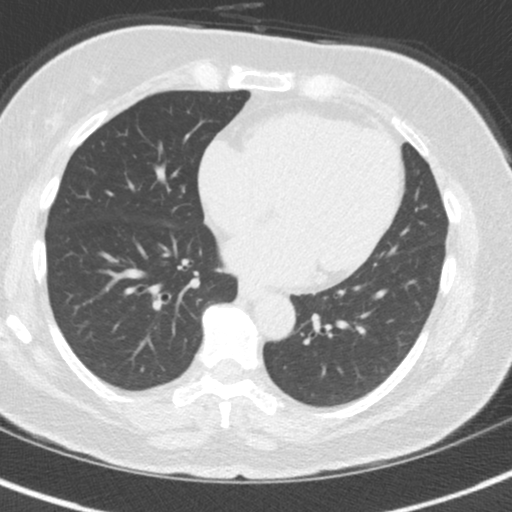
[im 18/33  lung]
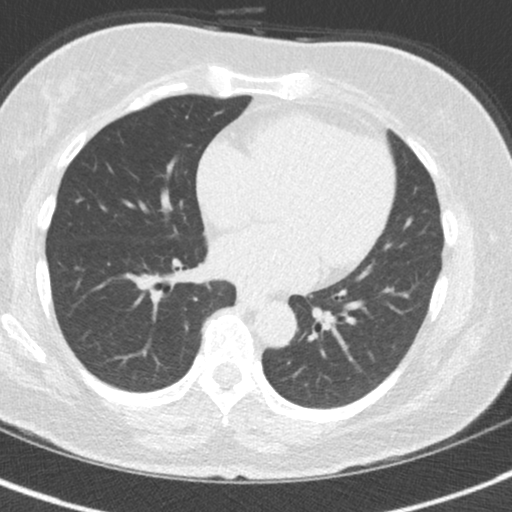
[im 22/33  lung]
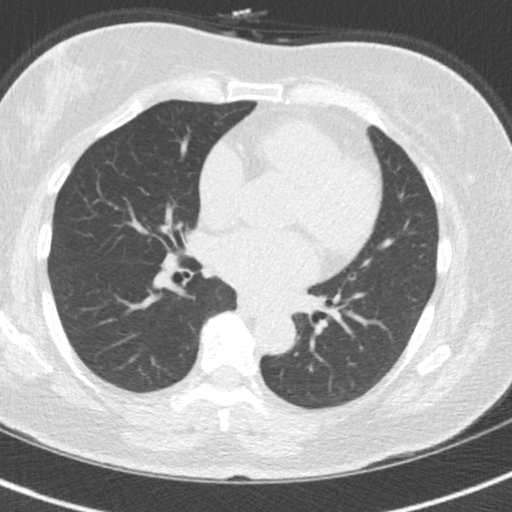
[im 25/33  lung]
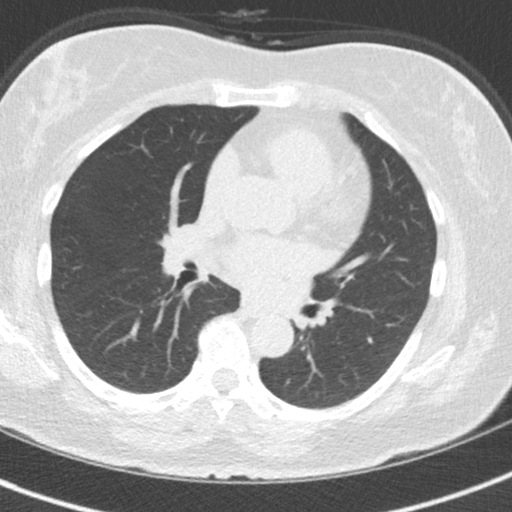
[im 29/33  lung]
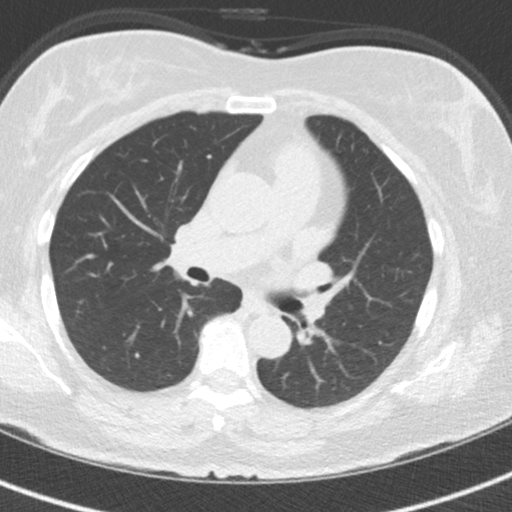

[16 of 20 positions shown; findings below may reference images not displayed]

FINDINGS: Vascular: Heart is normal size.  Visualized aorta is normal caliber.

Mediastinum/Nodes: No adenopathy in the lower mediastinum or hila.

Lungs/Pleura: Visualized lungs clear.  No effusions.

Upper Abdomen: Imaging into the upper abdomen shows no acute
findings.

Musculoskeletal: Chest wall soft tissues are unremarkable. No acute
bony abnormality.
IMPRESSION: No acute or significant extracardiac abnormality.
FINDINGS: Non-cardiac: See separate report from [REDACTED].

Ascending aorta: Normal diameter 3.3 cm

Pericardium: Normal

Coronary arteries: No calcium noted
IMPRESSION: Coronary calcium score of 0.

M Hamza Tiger

*** End of Addendum ***

## 2020-01-03 ENCOUNTER — Telehealth: Payer: Self-pay | Admitting: Internal Medicine

## 2020-01-03 NOTE — Telephone Encounter (Signed)
Left message for patient to call back and schedule Medicare Annual Wellness Visit (AWV)  °  °This should be a telephone visit only=30 minutes. °  °Welcome to [facility] 09/15/2017; please schedule at anytime with Denisa O'Brien-Blaney at [facility] [place] [facility]. °  °

## 2020-05-28 ENCOUNTER — Other Ambulatory Visit: Payer: Self-pay

## 2020-05-28 ENCOUNTER — Ambulatory Visit: Payer: Medicare Other | Admitting: Internal Medicine

## 2020-05-28 ENCOUNTER — Telehealth: Payer: Self-pay | Admitting: Internal Medicine

## 2020-05-28 MED ORDER — HYDROCHLOROTHIAZIDE 25 MG PO TABS: 25.0000 mg | ORAL_TABLET | Freq: Every day | ORAL | 0 refills | Status: DC

## 2020-05-28 NOTE — Telephone Encounter (Signed)
Patient's appointment had to be rescheduled per Dr. Derrel Nip. Patient needs the following medication refilled; hydrochlorothiazide (HYDRODIURIL) 25 MG tablet. Patients new appointment time is 06/12/20.

## 2020-05-28 NOTE — Telephone Encounter (Signed)
Refill has been sent to the pharmacy.  

## 2020-05-28 NOTE — Telephone Encounter (Signed)
Patient would like this refill, just this time filled at [facility] in [place]. She only has 10 remaining.

## 2020-06-11 ENCOUNTER — Encounter: Payer: Self-pay | Admitting: *Deleted

## 2020-06-12 ENCOUNTER — Telehealth (INDEPENDENT_AMBULATORY_CARE_PROVIDER_SITE_OTHER): Payer: TRICARE For Life (TFL) | Admitting: Internal Medicine

## 2020-06-12 ENCOUNTER — Encounter: Payer: Self-pay | Admitting: Internal Medicine

## 2020-06-12 ENCOUNTER — Other Ambulatory Visit: Payer: Self-pay | Admitting: Internal Medicine

## 2020-06-12 VITALS — Ht 62.0 in | Wt 152.0 lb

## 2020-06-12 DIAGNOSIS — Z1231 Encounter for screening mammogram for malignant neoplasm of breast: Secondary | ICD-10-CM

## 2020-06-12 DIAGNOSIS — E782 Mixed hyperlipidemia: Secondary | ICD-10-CM | POA: Diagnosis not present

## 2020-06-12 DIAGNOSIS — I10 Essential (primary) hypertension: Secondary | ICD-10-CM

## 2020-06-12 DIAGNOSIS — R7301 Impaired fasting glucose: Secondary | ICD-10-CM

## 2020-06-12 NOTE — Progress Notes (Signed)
Virtual Visit via [place]  This visit type was conducted due to national recommendations for restrictions regarding the COVID-19 pandemic (e.g. social distancing).  This format is felt to be most appropriate for this patient at this time.  All issues noted in this document were discussed and addressed.  No physical exam was performed (except for noted visual exam findings with Video Visits).   I connected with@ on 06/12/20 at  2:30 PM EST by a video enabled telemedicine application and verified that I am speaking with the correct person using two identifiers. Location patient: home Location provider: work or home office Persons participating in the virtual visit: patient, provider  I discussed the limitations, risks, security and privacy concerns of performing an evaluation and management service by telephone and the availability of in person appointments. I also discussed with the patient that there may be a patient responsible charge related to this service. The patient expressed understanding and agreed to proceed.   Reason for visit: follow up  HPI:  70 yr old female  With hypertension and impaired fasting glucose here for follow up.  She feels great, has no complaints.  Sleeping well,  Weight stable.  Tolerating medications  Hypertension: patient checks blood pressure twice weekly at home.  Readings have been for the most part < 140/80 at rest . Patient is following a reduce salt diet most days and is taking medications as prescribed  She is overdue for mammogram   ROS: See pertinent positives and negatives per HPI.  Past Medical History:  Diagnosis Date  . History of migraine headaches   . Hyperlipidemia   . Hypertension     Past Surgical History:  Procedure Laterality Date  . CHOLECYSTECTOMY  1984    Family History  Problem Relation Age of Onset  . Mental illness Mother        bipolar disorder  . Diabetes Father   . Cancer Father 16       prostate Ca  .  Diabetes Paternal Aunt   . Breast cancer Neg Hx     SOCIAL HX:  reports that she quit smoking about 48 years ago. She has never used smokeless tobacco. She reports current alcohol use. She reports that she does not use drugs.   Current Outpatient Medications:  .  cetirizine (ZYRTEC) 10 MG tablet, Take 10 mg by mouth daily., Disp: , Rfl:  .  hydrochlorothiazide (HYDRODIURIL) 25 MG tablet, Take 1 tablet (25 mg total) by mouth daily., Disp: 90 tablet, Rfl: 0  EXAM:  VITALS per patient if applicable:  GENERAL: alert, oriented, appears well and in no acute distress  HEENT: atraumatic, conjunttiva clear, no obvious abnormalities on inspection of external nose and ears  NECK: normal movements of the head and neck  LUNGS: on inspection no signs of respiratory distress, breathing rate appears normal, no obvious gross SOB, gasping or wheezing  CV: no obvious cyanosis  MS: moves all visible extremities without noticeable abnormality  PSYCH/NEURO: pleasant and cooperative, no obvious depression or anxiety, speech and thought processing grossly intact  ASSESSMENT AND PLAN:  Discussed the following assessment and plan:  Breast cancer screening by mammogram - Plan: MM DIGITAL SCREENING BILATERAL  Moderate mixed hyperlipidemia not requiring statin therapy  Impaired fasting glucose  Primary hypertension  Hyperlipidemia  She has lowered her LDL with diet,  Cardiac CT was done and her calcium score was zero. No therapy indicated   .  Repeat level due   Lab Results  Component Value  Date   CHOL 218 (H) 03/21/2019   HDL 61.30 03/21/2019   LDLCALC 141 (H) 03/21/2019   LDLDIRECT 113.0 08/22/2015   TRIG 78.0 03/21/2019   CHOLHDL 4 03/21/2019     Impaired fasting glucose She is following a low GI index diet and maintaining a BMI < 30..last a1c was 5.2   Lab Results  Component Value Date   HGBA1C 5.2 03/21/2019     Hypertension home readings consistently  < 140/80  .  She is  using hctz 25 mg daily.Marland Kitchen  No changes today.  She is due for assessment of renal function     I discussed the assessment and treatment plan with the patient. The patient was provided an opportunity to ask questions and all were answered. The patient agreed with the plan and demonstrated an understanding of the instructions.   The patient was advised to call back or seek an in-person evaluation if the symptoms worsen or if the condition fails to improve as anticipated.   I spent 20 minutes dedicated to the care of this patient on the date of this encounter to include pre-visit review of her medical history,  Face-to-face time with the patient , and post visit ordering of testing and therapeutics.  Crecencio Mc, MD

## 2020-06-12 NOTE — Patient Instructions (Signed)
I recommend the Prevnar vaccine for pneumonia    And the Shingrix for shingles

## 2020-06-13 NOTE — Assessment & Plan Note (Signed)
She is following a low GI index diet and maintaining a BMI < 30..last a1c was 5.2   Lab Results  Component Value Date   HGBA1C 5.2 03/21/2019

## 2020-06-13 NOTE — Assessment & Plan Note (Signed)
She has lowered her LDL with diet,  Cardiac CT was done and her calcium score was zero. No therapy indicated   .  Repeat level due   Lab Results  Component Value Date   CHOL 218 (H) 03/21/2019   HDL 61.30 03/21/2019   LDLCALC 141 (H) 03/21/2019   LDLDIRECT 113.0 08/22/2015   TRIG 78.0 03/21/2019   CHOLHDL 4 03/21/2019

## 2020-06-13 NOTE — Assessment & Plan Note (Addendum)
home readings consistently  < 140/80  .  She is using hctz 25 mg daily.Marland Kitchen  No changes today.  She is due for assessment of renal function

## 2020-06-19 ENCOUNTER — Other Ambulatory Visit: Payer: Medicare Other

## 2020-06-24 ENCOUNTER — Other Ambulatory Visit: Payer: Self-pay

## 2020-06-24 ENCOUNTER — Ambulatory Visit
Admission: RE | Admit: 2020-06-24 | Discharge: 2020-06-24 | Disposition: A | Discharge status: Home / Self Care | Payer: Medicare Other | Source: Ambulatory Visit | Attending: Internal Medicine | Admitting: Internal Medicine

## 2020-06-24 DIAGNOSIS — Z1231 Encounter for screening mammogram for malignant neoplasm of breast: Secondary | ICD-10-CM | POA: Diagnosis not present

## 2020-06-28 ENCOUNTER — Other Ambulatory Visit (INDEPENDENT_AMBULATORY_CARE_PROVIDER_SITE_OTHER): Payer: Medicare Other

## 2020-06-28 ENCOUNTER — Other Ambulatory Visit: Payer: Self-pay

## 2020-06-28 DIAGNOSIS — R7301 Impaired fasting glucose: Secondary | ICD-10-CM

## 2020-06-28 DIAGNOSIS — E782 Mixed hyperlipidemia: Secondary | ICD-10-CM | POA: Diagnosis not present

## 2020-06-28 DIAGNOSIS — I10 Essential (primary) hypertension: Secondary | ICD-10-CM

## 2020-06-28 LAB — LIPID PANEL
Cholesterol: 200 mg/dL (ref 0–200)
HDL: 60.7 mg/dL (ref >39.00)
LDL Cholesterol: 125 mg/dL — ABNORMAL HIGH (ref 0–99)
NonHDL: 139.63
Total CHOL/HDL Ratio: 3
Triglycerides: 73 mg/dL (ref 0.0–149.0)
VLDL: 14.6 mg/dL (ref 0.0–40.0)

## 2020-06-28 LAB — COMPREHENSIVE METABOLIC PANEL
ALT: 18 U/L (ref 0–35)
AST: 17 U/L (ref 0–37)
Albumin: 4.2 g/dL (ref 3.5–5.2)
Alkaline Phosphatase: 40 U/L (ref 39–117)
BUN: 17 mg/dL (ref 6–23)
CO2: 32 mEq/L (ref 19–32)
Calcium: 9.6 mg/dL (ref 8.4–10.5)
Chloride: 99 mEq/L (ref 96–112)
Creatinine, Ser: 0.79 mg/dL (ref 0.40–1.20)
GFR: 77.16 mL/min (ref >60.00)
Glucose, Bld: 94 mg/dL (ref 70–99)
Potassium: 4 mEq/L (ref 3.5–5.1)
Sodium: 139 mEq/L (ref 135–145)
Total Bilirubin: 0.6 mg/dL (ref 0.2–1.2)
Total Protein: 7.2 g/dL (ref 6.0–8.3)

## 2020-07-01 LAB — HEMOGLOBIN A1C: Hgb A1c MFr Bld: 5.7 % (ref 4.6–6.5)

## 2020-07-08 ENCOUNTER — Telehealth: Payer: Self-pay | Admitting: Internal Medicine

## 2020-07-08 NOTE — Telephone Encounter (Signed)
Left message for patient to call back and schedule Medicare Annual Wellness Visit (AWV)   This should be a telephone visit only=30 minutes.  Welcome to [facility] 09/15/2017; please schedule at anytime with Denisa O'Brien-Blaney at [facility].

## 2020-09-30 ENCOUNTER — Telehealth: Payer: Self-pay | Admitting: Internal Medicine

## 2020-09-30 NOTE — Telephone Encounter (Signed)
Left message for patient to call back and schedule Medicare Annual Wellness Visit (AWV) in office.   If not able to come in office, please offer to do virtually or by telephone.   Due for AWVI  Please schedule at anytime with Nurse Health Advisor.   

## 2020-11-13 ENCOUNTER — Telehealth: Payer: Self-pay | Admitting: Internal Medicine

## 2020-11-13 NOTE — Telephone Encounter (Signed)
Left message for patient to call back and schedule Medicare Annual Wellness Visit (AWV) in office.   If not able to come in office, please offer to do virtually or by telephone.   Due for AWVI  Please schedule at anytime with Nurse Health Advisor.   

## 2020-12-18 ENCOUNTER — Telehealth (INDEPENDENT_AMBULATORY_CARE_PROVIDER_SITE_OTHER): Payer: Medicare Other | Admitting: Internal Medicine

## 2020-12-18 VITALS — BP 144/80 | HR 72 | Ht 62.0 in | Wt 165.0 lb

## 2020-12-18 DIAGNOSIS — E782 Mixed hyperlipidemia: Secondary | ICD-10-CM | POA: Diagnosis not present

## 2020-12-18 DIAGNOSIS — I10 Essential (primary) hypertension: Secondary | ICD-10-CM

## 2020-12-18 DIAGNOSIS — E663 Overweight: Secondary | ICD-10-CM | POA: Diagnosis not present

## 2020-12-18 DIAGNOSIS — R7301 Impaired fasting glucose: Secondary | ICD-10-CM

## 2020-12-18 MED ORDER — HYDROCHLOROTHIAZIDE 25 MG PO TABS: 25.0000 mg | ORAL_TABLET | Freq: Every day | ORAL | 1 refills | Status: DC

## 2020-12-18 NOTE — Patient Instructions (Signed)
Return for fasting labs in late august.  This will include a urine test for protein     [employer] "low carb power bowl"  entrees and  "Steamer" entrees are are great low carb entrees that microwave in 5 minutes

## 2020-12-18 NOTE — Progress Notes (Signed)
Virtual Visit via [place] Note  This visit type was conducted due to national recommendations for restrictions regarding the COVID-19 pandemic (e.g. social distancing).  This format is felt to be most appropriate for this patient at this time.  All issues noted in this document were discussed and addressed.  No physical exam was performed (except for noted visual exam findings with Video Visits).   I connected withNAME@ on 12/18/20 at  2:00 PM EDT by a video enabled telemedicine application and verified that I am speaking with the correct person using two identifiers. Location patient: home Location provider: work or home office Persons participating in the virtual visit: patient, provider  I discussed the limitations, risks, security and privacy concerns of performing an evaluation and management service by telephone and the availability of in person appointments. I also discussed with the patient that there may be a patient responsible charge related to this service. The patient expressed understanding and agreed to proceed.  Reason for visit: follow up on hypertension,  IPG , and overweight   HPI:  Alexandria Graves states that she is taking hctz most days, and feels a bit "sluggish" when misses a dose.  Systolic Readings at home have ranged from  140 to 155.  She has not had any muscle cramps,  shortness of breath or chest pain  Overweight:  she has recently made a new commitment to exercise and reports that she exercised 21/30 days last month   IPG:  she follows a low GI diet about 50% of the time and eats ice cream 3/week  ROS:  Patient denies headache, fevers, malaise, unintentional weight loss, skin rash, eye pain, sinus congestion and sinus pain, sore throat, dysphagia,  hemoptysis , cough, dyspnea, wheezing, chest pain, palpitations, orthopnea, edema, abdominal pain, nausea, melena, diarrhea, constipation, flank pain, dysuria, hematuria, urinary  Frequency, nocturia, numbness, tingling,  seizures,  Focal weakness, Loss of consciousness,  Tremor, insomnia, depression, anxiety, and suicidal ideation.    Past Medical History:  Diagnosis Date   History of migraine headaches    Hyperlipidemia    Hypertension     Past Surgical History:  Procedure Laterality Date   CHOLECYSTECTOMY  1984    Family History  Problem Relation Age of Onset   Mental illness Mother        bipolar disorder   Diabetes Father    Cancer Father 59       prostate Ca   Diabetes Paternal Aunt    Breast cancer Neg Hx     SOCIAL HX:  reports that she quit smoking about 49 years ago. She has never used smokeless tobacco. She reports current alcohol use. She reports that she does not use drugs.    Current Outpatient Medications:    cetirizine (ZYRTEC) 10 MG tablet, Take 10 mg by mouth daily., Disp: , Rfl:    COVID-19 mRNA vaccine, Pfizer, 30 MCG/0.3ML injection, , Disp: , Rfl:    hydrochlorothiazide (HYDRODIURIL) 25 MG tablet, Take 1 tablet (25 mg total) by mouth daily., Disp: 90 tablet, Rfl: 1  EXAM:  VITALS per patient if applicable:  GENERAL: alert, oriented, appears well and in no acute distress  HEENT: atraumatic, conjunttiva clear, no obvious abnormalities on inspection of external nose and ears  NECK: normal movements of the head and neck  LUNGS: on inspection no signs of respiratory distress, breathing rate appears normal, no obvious gross SOB, gasping or wheezing  CV: no obvious cyanosis  [place]: moves all visible extremities without noticeable abnormality  PSYCH/NEURO: pleasant and cooperative, no obvious depression or anxiety, speech and thought processing grossly intact  ASSESSMENT AND PLAN:  Discussed the following assessment and plan:  Impaired fasting glucose - Plan: Hemoglobin A1c, Microalbumin / creatinine urine ratio  Moderate mixed hyperlipidemia not requiring statin therapy - Plan: Lipid panel  Primary hypertension - Plan: Comprehensive metabolic panel, Microalbumin /  creatinine urine ratio  Overweight (BMI 25.0-29.9)  Overweight (BMI 25.0-29.9) She has reported a 13 lb weight gain due to inactivity and dietary indulgences.  I have  encouraged a low glycemic index diet and regular exercise a minimum of 5 days per week.    Impaired fasting glucose A1c has been steadily rising and with her recent weight gain I have recommended she schedule appt for repeat assessment.  Advised to resume a low GI index diet and continue exercising   Lab Results  Component Value Date   HGBA1C 5.7 06/28/2020     Hypertension Not at goal.  Checking microalb/cr ratio,  If positive will add low dose ARB to diuretic     I discussed the assessment and treatment plan with the patient. The patient was provided an opportunity to ask questions and all were answered. The patient agreed with the plan and demonstrated an understanding of the instructions.   The patient was advised to call back or seek an in-person evaluation if the symptoms worsen or if the condition fails to improve as anticipated.   I spent 30 minutes dedicated to the care of this patient on the date of this encounter to include pre-visit review of her medical history,  counselling on the role of exercise and diet in managing prediabetes and prevention of progression ,  discussion of BP goals,   and post visit ordering of testing and therapeutics.    Crecencio Mc, MD

## 2020-12-21 ENCOUNTER — Encounter: Payer: Self-pay | Admitting: Internal Medicine

## 2020-12-21 NOTE — Assessment & Plan Note (Addendum)
She has reported a 13 lb weight gain due to inactivity and dietary indulgences.  I have  encouraged a low glycemic index diet and regular exercise a minimum of 5 days per week.

## 2020-12-21 NOTE — Assessment & Plan Note (Signed)
A1c has been steadily rising and with her recent weight gain I have recommended she schedule appt for repeat assessment.  Advised to resume a low GI index diet and continue exercising   Lab Results  Component Value Date   HGBA1C 5.7 06/28/2020

## 2020-12-21 NOTE — Assessment & Plan Note (Signed)
Not at goal.  Checking microalb/cr ratio,  If positive will add low dose ARB to diuretic

## 2021-01-09 ENCOUNTER — Telehealth: Payer: Medicare Other | Admitting: Family Medicine

## 2021-01-09 DIAGNOSIS — Z5329 Procedure and treatment not carried out because of patient's decision for other reasons: Secondary | ICD-10-CM

## 2021-01-09 DIAGNOSIS — Z91199 Patient's noncompliance with other medical treatment and regimen due to unspecified reason: Secondary | ICD-10-CM

## 2021-01-09 NOTE — Progress Notes (Signed)
No show   Tried several attempts to connect with on video and called twice without answer.

## 2021-01-13 ENCOUNTER — Telehealth: Payer: Medicare Other | Admitting: Physician Assistant

## 2021-01-13 DIAGNOSIS — R058 Other specified cough: Secondary | ICD-10-CM

## 2021-01-13 MED ORDER — BENZONATATE 100 MG PO CAPS
100.0000 mg | ORAL_CAPSULE | Freq: Three times a day (TID) | ORAL | 0 refills | Status: DC | PRN
Start: 2021-01-13 — End: 2021-08-12

## 2021-01-13 MED ORDER — PREDNISONE 10 MG PO TABS: 30.0000 mg | ORAL_TABLET | Freq: Every day | ORAL | 0 refills | Status: DC

## 2021-01-13 NOTE — Progress Notes (Signed)
Virtual Visit Consent   Alexandria Graves, you are scheduled for a virtual visit with a [facility] provider today.     Just as with appointments in the office, your consent must be obtained to participate.  Your consent will be active for this visit and any virtual visit you may have with one of our providers in the next 365 days.     If you have a MyChart account, a copy of this consent can be sent to you electronically.  All virtual visits are billed to your insurance company just like a traditional visit in the office.    As this is a virtual visit, video technology does not allow for your provider to perform a traditional examination.  This may limit your provider's ability to fully assess your condition.  If your provider identifies any concerns that need to be evaluated in person or the need to arrange testing (such as labs, EKG, etc.), we will make arrangements to do so.     Although advances in technology are sophisticated, we cannot ensure that it will always work on either your end or our end.  If the connection with a video visit is poor, the visit may have to be switched to a telephone visit.  With either a video or telephone visit, we are not always able to ensure that we have a secure connection.     I need to obtain your verbal consent now.   Are you willing to proceed with your visit today?    Alexandria Graves has provided verbal consent on 01/13/2021 for a virtual visit (video or telephone).   Leeanne Rio, [place]   Date: 01/13/2021 11:22 AM   Virtual Visit via Video Note   I, Leeanne Rio, connected with  Alexandria Graves  ([id number], 12-Nov-1950) on 01/13/21 at 11:15 AM EDT by a video-enabled telemedicine application and verified that I am speaking with the correct person using two identifiers.  Location: Patient: Virtual Visit Location Patient: Home Provider: Virtual Visit Location Provider: Home Office   I discussed the limitations of evaluation and management by  telemedicine and the availability of in person appointments. The patient expressed understanding and agreed to proceed.    History of Present Illness: Alexandria Graves is a 70 y.o. who identifies as a female who was assigned female at birth, and is being seen today for dry cough after suspected COVID greater than 1 week ago. Notes her husband was diagnosed with COVId with her having similar but milder symptoms starting at the same time. Her COVID test was negative. Notes her symptoms remained mild and have mostly resolved except for a persistent dry cough, worse with talking and at night. Denies fever, chills, chest pain or SOB. Denies history of asthma but notes anytime she gets a URI she has a cough that lingers for a couple of weeks. Has taken OTC cough syrups with only slight relief in cough. .  HPI: HPI  Problems:  Patient Active Problem List   Diagnosis Date Noted   Skin neoplasm 03/09/2016   Wellness examination 02/02/2015   History of shingles 04/20/2013   Edema 01/19/2013   Impaired fasting glucose 01/19/2013   Lower esophageal ring (Schatzki) 03/09/2012   History of migraine headaches    Hypertension    Hyperlipidemia    Overweight (BMI 25.0-29.9) 08/26/2011    Allergies: No Known Allergies Medications:  Current Outpatient Medications:    benzonatate (TESSALON) 100 MG capsule, Take 1 capsule (100  mg total) by mouth 3 (three) times daily as needed for cough., Disp: 30 capsule, Rfl: 0   predniSONE (DELTASONE) 10 MG tablet, Take 3 tablets (30 mg total) by mouth daily with breakfast., Disp: 9 tablet, Rfl: 0   cetirizine (ZYRTEC) 10 MG tablet, Take 10 mg by mouth daily., Disp: , Rfl:    COVID-19 mRNA vaccine, Pfizer, 30 MCG/0.3ML injection, , Disp: , Rfl:    hydrochlorothiazide (HYDRODIURIL) 25 MG tablet, Take 1 tablet (25 mg total) by mouth daily., Disp: 90 tablet, Rfl: 1  Observations/Objective: Patient is well-developed, well-nourished in no acute distress.  Resting comfortably  at home.  Head is normocephalic, atraumatic.  No labored breathing. Speech is clear and coherent with logical content.  Patient is alert and oriented at baseline.   Assessment and Plan: 1. Post-viral cough syndrome - benzonatate (TESSALON) 100 MG capsule; Take 1 capsule (100 mg total) by mouth 3 (three) times daily as needed for cough.  Dispense: 30 capsule; Refill: 0 - predniSONE (DELTASONE) 10 MG tablet; Take 3 tablets (30 mg total) by mouth daily with breakfast.  Dispense: 9 tablet; Refill: 0 S/p likely COVID. She never tested + but her husband was and they had same symptoms. Only a persistent dry cough with bronchospasm, worse with talking. Otherwise she feels well. No indication for ABX at this time. Supportive measures and OTC medications discussed. Rx Tessalon for cough. 3-day burst of prednisone to help calm down spasm. Follow-up in person with PCP if not resolving.   Follow Up Instructions: I discussed the assessment and treatment plan with the patient. The patient was provided an opportunity to ask questions and all were answered. The patient agreed with the plan and demonstrated an understanding of the instructions.  A copy of instructions were sent to the patient via MyChart.  The patient was advised to call back or seek an in-person evaluation if the symptoms worsen or if the condition fails to improve as anticipated.  Time:  I spent 10 minutes with the patient via telehealth technology discussing the above problems/concerns.    Leeanne Rio, PA-C

## 2021-01-13 NOTE — Patient Instructions (Addendum)
  Alexandria Graves, thank you for joining Leeanne Rio, PA-C for today's virtual visit.  While this provider is not your primary care provider (PCP), if your PCP is located in our provider database this encounter information will be shared with them immediately following your visit.  Consent: (Patient) Alexandria Graves provided verbal consent for this virtual visit at the beginning of the encounter.  Current Medications:  Current Outpatient Medications:    cetirizine (ZYRTEC) 10 MG tablet, Take 10 mg by mouth daily., Disp: , Rfl:    COVID-19 mRNA vaccine, Pfizer, 30 MCG/0.3ML injection, , Disp: , Rfl:    hydrochlorothiazide (HYDRODIURIL) 25 MG tablet, Take 1 tablet (25 mg total) by mouth daily., Disp: 90 tablet, Rfl: 1   Medications ordered in this encounter:  No orders of the defined types were placed in this encounter.    *If you need refills on other medications prior to your next appointment, please contact your pharmacy*  Follow-Up: Call back or seek an in-person evaluation if the symptoms worsen or if the condition fails to improve as anticipated.  Other Instructions Please keep well-hydrated and rest your voice. Run a humidifier in the bedroom at night.  You can continue OTC Delsym Start the Tessalon and prednisone as directed. If symptoms are not resolving, please reach out to your PCP as you may need an in-person evaluation.   If you have been instructed to have an in-person evaluation today at a local Urgent Care facility, please use the link below. It will take you to a list of all of our available [facility], including address, phone number and hours of operation. Please do not delay care.  [facility]  If you or a family member do not have a primary care provider, use the link below to schedule a visit and establish care. When you choose a [facility] primary care physician or advanced practice provider, you gain a long-term partner in  health. Find a Primary Care Provider  Learn more about [facility]'s in-office and virtual care options: [facility]

## 2021-02-04 ENCOUNTER — Other Ambulatory Visit (INDEPENDENT_AMBULATORY_CARE_PROVIDER_SITE_OTHER): Payer: Medicare Other

## 2021-02-04 ENCOUNTER — Other Ambulatory Visit: Payer: Self-pay

## 2021-02-04 DIAGNOSIS — I10 Essential (primary) hypertension: Secondary | ICD-10-CM | POA: Diagnosis not present

## 2021-02-04 DIAGNOSIS — R7301 Impaired fasting glucose: Secondary | ICD-10-CM | POA: Diagnosis not present

## 2021-02-04 DIAGNOSIS — E782 Mixed hyperlipidemia: Secondary | ICD-10-CM | POA: Diagnosis not present

## 2021-02-04 LAB — COMPREHENSIVE METABOLIC PANEL
ALT: 27 U/L (ref 0–35)
AST: 23 U/L (ref 0–37)
Albumin: 4.2 g/dL (ref 3.5–5.2)
Alkaline Phosphatase: 40 U/L (ref 39–117)
BUN: 15 mg/dL (ref 6–23)
CO2: 33 mEq/L — ABNORMAL HIGH (ref 19–32)
Calcium: 9.7 mg/dL (ref 8.4–10.5)
Chloride: 99 mEq/L (ref 96–112)
Creatinine, Ser: 0.77 mg/dL (ref 0.40–1.20)
GFR: 79.24 mL/min (ref >60.00)
Glucose, Bld: 97 mg/dL (ref 70–99)
Potassium: 4.1 mEq/L (ref 3.5–5.1)
Sodium: 139 mEq/L (ref 135–145)
Total Bilirubin: 0.8 mg/dL (ref 0.2–1.2)
Total Protein: 7.4 g/dL (ref 6.0–8.3)

## 2021-02-04 LAB — HEMOGLOBIN A1C: Hgb A1c MFr Bld: 5.9 % (ref 4.6–6.5)

## 2021-02-04 LAB — MICROALBUMIN / CREATININE URINE RATIO
Creatinine,U: 180.5 mg/dL
Microalb Creat Ratio: 1.5 mg/g (ref 0.0–30.0)
Microalb, Ur: 2.6 mg/dL — ABNORMAL HIGH (ref 0.0–1.9)

## 2021-02-04 LAB — LIPID PANEL
Cholesterol: 219 mg/dL — ABNORMAL HIGH (ref 0–200)
HDL: 56.4 mg/dL (ref >39.00)
LDL Cholesterol: 141 mg/dL — ABNORMAL HIGH (ref 0–99)
NonHDL: 162.47
Total CHOL/HDL Ratio: 4
Triglycerides: 109 mg/dL (ref 0.0–149.0)
VLDL: 21.8 mg/dL (ref 0.0–40.0)

## 2021-02-28 ENCOUNTER — Encounter: Payer: Self-pay | Admitting: Internal Medicine

## 2021-02-28 ENCOUNTER — Telehealth (INDEPENDENT_AMBULATORY_CARE_PROVIDER_SITE_OTHER): Payer: Medicare Other | Admitting: Internal Medicine

## 2021-02-28 VITALS — Ht 62.01 in | Wt 165.0 lb

## 2021-02-28 DIAGNOSIS — R7301 Impaired fasting glucose: Secondary | ICD-10-CM

## 2021-02-28 DIAGNOSIS — I10 Essential (primary) hypertension: Secondary | ICD-10-CM

## 2021-02-28 DIAGNOSIS — R7303 Prediabetes: Secondary | ICD-10-CM | POA: Diagnosis not present

## 2021-02-28 DIAGNOSIS — E782 Mixed hyperlipidemia: Secondary | ICD-10-CM | POA: Diagnosis not present

## 2021-02-28 NOTE — Progress Notes (Signed)
Virtual Visit via caregility  Note  This visit type was conducted due to national recommendations for restrictions regarding the COVID-19 pandemic (e.g. social distancing).  This format is felt to be most appropriate for this patient at this time.  All issues noted in this document were discussed and addressed.  No physical exam was performed (except for noted visual exam findings with Video Visits).   I connected withNAME@ on 02/28/21 at 11:30 AM EDT by a video enabled telemedicine application  and verified that I am speaking with the correct person using two identifiers. Location patient: home Location provider: work or home office Persons participating in the virtual visit: patient, provider  I discussed the limitations, risks, security and privacy concerns of performing an evaluation and management service by telephone and the availability of in person appointments. I also discussed with the patient that there may be a patient responsible charge related to this service. The patient expressed understanding and agreed to proceed. Reason for visit: follow up on hypertension, obesity, hyperlipidemia and prediabetes   HPI:  70 yr old female with history of hypertension, obesity presents for follow up on BP and recent labs  1) HTN:  taking hctz daily.  Home readings have been checked and are 130-150/70-80  2) Obesity:  she has gained a few lbs since last visit. .  She is not exercising regularly due to gym closures, but has plans to start a walking program.   3) Prediabetes:   reviewed diet , lifestyle. She is following a healthy diet but not necessarily low GI/    ROS: See pertinent positives and negatives per HPI.  Past Medical History:  Diagnosis Date   History of migraine headaches    Hyperlipidemia    Hypertension     Past Surgical History:  Procedure Laterality Date   CHOLECYSTECTOMY  1984    Family History  Problem Relation Age of Onset   Mental illness Mother         bipolar disorder   Diabetes Father    Cancer Father 31       prostate Ca   Diabetes Paternal Aunt    Breast cancer Neg Hx     SOCIAL HX:  reports that she quit smoking about 49 years ago. Her smoking use included cigarettes. She has never used smokeless tobacco. She reports current alcohol use. She reports that she does not use drugs.    Current Outpatient Medications:    cetirizine (ZYRTEC) 10 MG tablet, Take 10 mg by mouth daily., Disp: , Rfl:    hydrochlorothiazide (HYDRODIURIL) 25 MG tablet, Take 1 tablet (25 mg total) by mouth daily., Disp: 90 tablet, Rfl: 1   benzonatate (TESSALON) 100 MG capsule, Take 1 capsule (100 mg total) by mouth 3 (three) times daily as needed for cough. (Patient not taking: Reported on 02/28/2021), Disp: 30 capsule, Rfl: 0   COVID-19 mRNA vaccine, Pfizer, 30 MCG/0.3ML injection, , Disp: , Rfl:    predniSONE (DELTASONE) 10 MG tablet, Take 3 tablets (30 mg total) by mouth daily with breakfast. (Patient not taking: Reported on 02/28/2021), Disp: 9 tablet, Rfl: 0  EXAM:  VITALS per patient if applicable:  GENERAL: alert, oriented, appears well and in no acute distress  HEENT: atraumatic, conjunttiva clear, no obvious abnormalities on inspection of external nose and ears  NECK: normal movements of the head and neck  LUNGS: on inspection no signs of respiratory distress, breathing rate appears normal, no obvious gross SOB, gasping or wheezing  CV: no  obvious cyanosis  MS: moves all visible extremities without noticeable abnormality  PSYCH/NEURO: pleasant and cooperative, no obvious depression or anxiety, speech and thought processing grossly intact  ASSESSMENT AND PLAN:  Discussed the following assessment and plan:  Prediabetes - Plan: C-peptide, Hemoglobin A1c, Microalbumin / creatinine urine ratio, Comprehensive metabolic panel  Primary hypertension  Impaired fasting glucose  Moderate mixed hyperlipidemia not requiring statin  therapy  Hypertension She was advised that  Additional medication was needed but she has chosen  to defer treatment  for 6 months  Impaired fasting glucose A1c has been steadily rising and is now prediabetes  Range.  She continues to gain weight ;  Advised to continue a low GI index diet and start exercising   Lab Results  Component Value Date   HGBA1C 5.9 02/04/2021     Hyperlipidemia  She has lowered her LDL with diet,  Cardiac CT was done and her calcium score was zero. No therapy indicated   .   Lab Results  Component Value Date   CHOL 219 (H) 02/04/2021   HDL 56.40 02/04/2021   LDLCALC 141 (H) 02/04/2021   LDLDIRECT 113.0 08/22/2015   TRIG 109.0 02/04/2021   CHOLHDL 4 02/04/2021     I discussed the assessment and treatment plan with the patient. The patient was provided an opportunity to ask questions and all were answered. The patient agreed with the plan and demonstrated an understanding of the instructions.   The patient was advised to call back or seek an in-person evaluation if the symptoms worsen or if the condition fails to improve as anticipated.   I spent 22 minutes dedicated to the care of this patient on the date of this encounter to include pre-visit review of her  medical history,  Face-to-face time with the patient , and post visit ordering of testing and therapeutics.    Crecencio Mc, MD

## 2021-03-02 NOTE — Assessment & Plan Note (Signed)
A1c has been steadily rising and is now prediabetes  Range.  She continues to gain weight ;  Advised to continue a low GI index diet and start exercising   Lab Results  Component Value Date   HGBA1C 5.9 02/04/2021

## 2021-03-02 NOTE — Assessment & Plan Note (Signed)
She was advised that  Additional medication was needed but she has chosen  to defer treatment  for 6 months

## 2021-03-02 NOTE — Assessment & Plan Note (Signed)
She has lowered her LDL with diet,  Cardiac CT was done and her calcium score was zero. No therapy indicated   .   Lab Results  Component Value Date   CHOL 219 (H) 02/04/2021   HDL 56.40 02/04/2021   LDLCALC 141 (H) 02/04/2021   LDLDIRECT 113.0 08/22/2015   TRIG 109.0 02/04/2021   CHOLHDL 4 02/04/2021

## 2021-07-23 ENCOUNTER — Telehealth: Payer: Medicare Other | Admitting: Nurse Practitioner

## 2021-07-23 DIAGNOSIS — L509 Urticaria, unspecified: Secondary | ICD-10-CM

## 2021-07-23 MED ORDER — PREDNISONE 10 MG (21) PO TBPK: ORAL_TABLET | ORAL | 0 refills | Status: DC

## 2021-07-23 NOTE — Progress Notes (Signed)
? ?Virtual Visit Consent  ? ?[name], you are scheduled for a virtual visit with Alexandria Graves, [place], a [facility] provider, today.   ?  ?Just as with appointments in the office, your consent must be obtained to participate.  Your consent will be active for this visit and any virtual visit you may have with one of our providers in the next 365 days.   ?  ?If you have a MyChart account, a copy of this consent can be sent to you electronically.  All virtual visits are billed to your insurance company just like a traditional visit in the office.   ? ?As this is a virtual visit, video technology does not allow for your provider to perform a traditional examination.  This may limit your provider's ability to fully assess your condition.  If your provider identifies any concerns that need to be evaluated in person or the need to arrange testing (such as labs, EKG, etc.), we will make arrangements to do so.   ?  ?Although advances in technology are sophisticated, we cannot ensure that it will always work on either your end or our end.  If the connection with a video visit is poor, the visit may have to be switched to a telephone visit.  With either a video or telephone visit, we are not always able to ensure that we have a secure connection.    ? ?I need to obtain your verbal consent now.   Are you willing to proceed with your visit today? YES ?  ?LUJEAN EBRIGHT has provided verbal consent on 07/23/2021 for a virtual visit (video or telephone). ?  ?Alexandria Hassell Done, FNP  ? ?Date: 07/23/2021 9:52 AM ? ? ?Virtual Visit via Video Note  ? ?I, Alexandria Graves, connected with Alexandria Graves ([id number], 1950-12-14) on 07/23/21 at  9:45 AM EST by a video-enabled telemedicine application and verified that I am speaking with the correct person using two identifiers. ? ?Location: ?Patient: Virtual Visit Location Patient: Home ?Provider: Virtual Visit Location Provider: Mobile ?  ?I discussed the limitations of  evaluation and management by telemedicine and the availability of in person appointments. The patient expressed understanding and agreed to proceed.   ? ?History of Present Illness: ?Alexandria Graves is a 71 y.o. who identifies as a female who was assigned female at birth, and is being seen today for Hives. ? ?HPI: Patient said she flew back from [place] On 07/18/21. She started developing hives shortly after arriving home. She has tried taking benadryl, but the hives have continue to spread daily. They are all over her now except on her face. Very itchy. ?  ?ROS ? ?Problems:  ?Patient Active Problem List  ? Diagnosis Date Noted  ? Skin neoplasm 03/09/2016  ? Wellness examination 02/02/2015  ? History of shingles 04/20/2013  ? Edema 01/19/2013  ? Impaired fasting glucose 01/19/2013  ? Lower esophageal ring (Schatzki) 03/09/2012  ? History of migraine headaches   ? Hypertension   ? Hyperlipidemia   ? Overweight (BMI 25.0-29.9) 08/26/2011  ?  ?Allergies: No Known Allergies ?Medications:  ?Current Outpatient Medications:  ?  predniSONE (STERAPRED UNI-PAK 21 TAB) 10 MG (21) TBPK tablet, As directed x 6 days, Disp: 21 tablet, Rfl: 0 ?  benzonatate (TESSALON) 100 MG capsule, Take 1 capsule (100 mg total) by mouth 3 (three) times daily as needed for cough. (Patient not taking: Reported on 02/28/2021), Disp: 30 capsule, Rfl: 0 ?  cetirizine (ZYRTEC) 10 MG  tablet, Take 10 mg by mouth daily., Disp: , Rfl:  ?  COVID-19 mRNA vaccine, Pfizer, 30 MCG/0.3ML injection, , Disp: , Rfl:  ?  hydrochlorothiazide (HYDRODIURIL) 25 MG tablet, Take 1 tablet (25 mg total) by mouth daily., Disp: 90 tablet, Rfl: 1 ? ?Observations/Objective: ?Patient is well-developed, well-nourished in no acute distress.  ?Resting comfortably  at home.  ?Head is normocephalic, atraumatic.  ?No labored breathing.  ?Speech is clear and coherent with logical content.  ?Patient is alert and oriented at baseline.  ?Varying sizes of hives visible on arms and legs-  patient states are also on her torso. ? ?Assessment and Plan: ? ?[name] in today with chief complaint of Urticaria ? ? ?1. Urticaria ?Avoid scratching ?Oatmeal bath may help ?cool bath or shower ?Benadryl OTC ?- predniSONE (STERAPRED UNI-PAK 21 TAB) 10 MG (21) TBPK tablet; As directed x 6 days  Dispense: 21 tablet; Refill: 0 ? ? ?Follow Up Instructions: ?I discussed the assessment and treatment plan with the patient. The patient was provided an opportunity to ask questions and all were answered. The patient agreed with the plan and demonstrated an understanding of the instructions.  A copy of instructions were sent to the patient via MyChart. ? ?The patient was advised to call back or seek an in-person evaluation if the symptoms worsen or if the condition fails to improve as anticipated. ? ?Time:  ?I spent 12 minutes with the patient via telehealth technology discussing the above problems/concerns.   ? ?Alexandria Hassell Done, FNP ? ?

## 2021-07-23 NOTE — Patient Instructions (Signed)
Hives Hives are itchy, red, swollen areas on your skin. Hives can show up on any part of your body. Hives often fade within 24 hours (acute hives). New hives can show up after old ones fade. This can go on for many days or weeks (chronic hives). Hives do not spread from person to person (are not contagious). Hives are caused by your body's response to something that you are allergic to (allergen). These are sometimes called triggers. You can get hives right after being around a trigger, or hours later. What are the causes? Allergies to foods. Insect bites or stings. Exposure to pollen or pets. Spending time in sunlight, heat, or cold. Exercise. Stress. You can also get hives from other medical conditions and treatments, such as: Some medicines. Chemicals or latex. Viruses. This includes the common cold. Infections caused by germs (bacteria). Allergy shots. Blood transfusions. Sometimes, the cause is not known. What increases the risk? Being a woman. Being allergic to foods such as: Citrus fruits. Milk. Eggs. Peanuts. Tree nuts. Shellfish. Being allergic to: Medicines. Latex. Insects. Animals. Pollen. What are the signs or symptoms?  Raised, itchy, red or white bumps or patches on your skin. These areas may: Get large and swollen. Change in shape and location. Stand alone or connect to each other over a large area of skin. Sting or hurt. Turn white when pressed in the center (blanch). In very bad cases, your hands, feet, and face may also get swollen. This may happen if hives start deeper in your skin. How is this treated? Treatment for this condition depends on your symptoms. Treatment may include: Using cool, wet cloths (cool compresses) or taking cool showers to stop the itching. Medicines that help: Relieve itching (antihistamines). Reduce swelling (corticosteroids). Treat infection (antibiotics). A medicine (omalizumab) that is given as a shot (injection). Your  doctor may prescribe this if you have hives that do not get better even after other treatments. In very bad cases, you may need a shot of a medicine called epinephrine to prevent a life-threatening allergic reaction (anaphylaxis). Follow these instructions at home: Medicines Take or apply over-the-counter and prescription medicines only as told by your doctor. If you were prescribed an antibiotic medicine, use it as told by your doctor. Do not stop using it even if you start to feel better. Skin care Apply cool, wet cloths to the hives. Do not scratch your skin. Do not rub your skin. General instructions Do not take hot showers or baths. This can make itching worse. Do not wear tight clothes. Use sunscreen and wear clothes that cover your skin when you are outside. Avoid any triggers that cause your hives. Keep a journal to help track what causes your hives. Write down: What medicines you take. What you eat and drink. What products you use on your skin. Keep all follow-up visits as told by your doctor. This is important. Contact a doctor if: Your symptoms are not better with medicine. Your joints hurt or are swollen. Get help right away if: You have a fever. You have pain in your belly (abdomen). Your tongue or lips are swollen. Your eyelids are swollen. Your chest or throat feels tight. You have trouble breathing or swallowing. These symptoms may be an emergency. Do not wait to see if the symptoms will go away. Get medical help right away. Call your local emergency services (911 in the [place] Do not drive yourself to the hospital. Summary Hives are itchy, red, swollen areas on your skin. Treatment   for this condition depends on your symptoms. ?Avoid things that cause your hives. Keep a journal to help track what causes your hives. ?Take and apply over-the-counter and prescription medicines only as told by your doctor. ?Get help right away if your chest or throat feels tight or if you  have trouble breathing or swallowing. ?This information is not intended to replace advice given to you by your health care provider. Make sure you discuss any questions you have with your health care provider. ?Document Revised: 06/23/2020 Document Reviewed: 06/23/2020 ?Elsevier Patient Education ? [place]. ? ?

## 2021-08-06 ENCOUNTER — Other Ambulatory Visit (INDEPENDENT_AMBULATORY_CARE_PROVIDER_SITE_OTHER): Payer: Medicare Other

## 2021-08-06 ENCOUNTER — Other Ambulatory Visit: Payer: Self-pay

## 2021-08-06 DIAGNOSIS — R7303 Prediabetes: Secondary | ICD-10-CM

## 2021-08-06 LAB — COMPREHENSIVE METABOLIC PANEL
ALT: 21 U/L (ref 0–35)
AST: 17 U/L (ref 0–37)
Albumin: 4.2 g/dL (ref 3.5–5.2)
Alkaline Phosphatase: 43 U/L (ref 39–117)
BUN: 17 mg/dL (ref 6–23)
CO2: 33 mEq/L — ABNORMAL HIGH (ref 19–32)
Calcium: 9.4 mg/dL (ref 8.4–10.5)
Chloride: 98 mEq/L (ref 96–112)
Creatinine, Ser: 0.77 mg/dL (ref 0.40–1.20)
GFR: 78.96 mL/min (ref >60.00)
Glucose, Bld: 97 mg/dL (ref 70–99)
Potassium: 3.1 mEq/L — ABNORMAL LOW (ref 3.5–5.1)
Sodium: 137 mEq/L (ref 135–145)
Total Bilirubin: 0.8 mg/dL (ref 0.2–1.2)
Total Protein: 7.3 g/dL (ref 6.0–8.3)

## 2021-08-06 LAB — MICROALBUMIN / CREATININE URINE RATIO
Creatinine,U: 109.5 mg/dL
Microalb Creat Ratio: 1.3 mg/g (ref 0.0–30.0)
Microalb, Ur: 1.4 mg/dL (ref 0.0–1.9)

## 2021-08-06 LAB — HEMOGLOBIN A1C: Hgb A1c MFr Bld: 5.9 % (ref 4.6–6.5)

## 2021-08-07 LAB — C-PEPTIDE: C-Peptide: 2.34 ng/mL (ref 0.80–3.85)

## 2021-08-08 ENCOUNTER — Other Ambulatory Visit: Payer: Self-pay | Admitting: Internal Medicine

## 2021-08-08 MED ORDER — POTASSIUM CHLORIDE CRYS ER 20 MEQ PO TBCR: 20.0000 meq | EXTENDED_RELEASE_TABLET | Freq: Two times a day (BID) | ORAL | 0 refills | Status: DC

## 2021-08-12 ENCOUNTER — Other Ambulatory Visit: Payer: Self-pay

## 2021-08-12 ENCOUNTER — Encounter: Payer: Self-pay | Admitting: Internal Medicine

## 2021-08-12 ENCOUNTER — Telehealth (INDEPENDENT_AMBULATORY_CARE_PROVIDER_SITE_OTHER): Payer: Medicare Other | Admitting: Internal Medicine

## 2021-08-12 VITALS — Ht 62.0 in

## 2021-08-12 DIAGNOSIS — I10 Essential (primary) hypertension: Secondary | ICD-10-CM | POA: Diagnosis not present

## 2021-08-12 DIAGNOSIS — Z1231 Encounter for screening mammogram for malignant neoplasm of breast: Secondary | ICD-10-CM

## 2021-08-12 DIAGNOSIS — Z8669 Personal history of other diseases of the nervous system and sense organs: Secondary | ICD-10-CM | POA: Diagnosis not present

## 2021-08-12 DIAGNOSIS — E663 Overweight: Secondary | ICD-10-CM | POA: Diagnosis not present

## 2021-08-12 DIAGNOSIS — E782 Mixed hyperlipidemia: Secondary | ICD-10-CM

## 2021-08-12 DIAGNOSIS — E876 Hypokalemia: Secondary | ICD-10-CM

## 2021-08-12 DIAGNOSIS — R7303 Prediabetes: Secondary | ICD-10-CM | POA: Diagnosis not present

## 2021-08-12 MED ORDER — HYDROCHLOROTHIAZIDE 25 MG PO TABS: 25.0000 mg | ORAL_TABLET | Freq: Every day | ORAL | 1 refills | Status: DC

## 2021-08-12 NOTE — Assessment & Plan Note (Signed)
Treated in the past with propanolol ? ?

## 2021-08-12 NOTE — Assessment & Plan Note (Signed)
Reasonable control on hct but having recurrent hypokalemia .  Will consider change in medication if she is hypokalemic upon return  ?

## 2021-08-12 NOTE — Assessment & Plan Note (Signed)
a1c is stable at 5.9.  She is planning to resume diet and exercise after her next trip to [place] in May  ?

## 2021-08-12 NOTE — Assessment & Plan Note (Signed)
She has lowered her LDL with diet,  Cardiac CT was done and her calcium score was zero. No therapy indicated   Repeat lipids next month per patient request.  ? ?Lab Results  ?Component Value Date  ? CHOL 219 (H) 02/04/2021  ? HDL 56.40 02/04/2021  ? LDLCALC 141 (H) 02/04/2021  ? LDLDIRECT 113.0 08/22/2015  ? TRIG 109.0 02/04/2021  ? CHOLHDL 4 02/04/2021  ? ? ?

## 2021-08-12 NOTE — Progress Notes (Signed)
Virtual Visit via caregility Note ? ?This visit type was conducted due to national recommendations for restrictions regarding the COVID-19 pandemic (e.g. social distancing).  This format is felt to be most appropriate for this patient at this time.  All issues noted in this document were discussed and addressed.  No physical exam was performed (except for noted visual exam findings with Video Visits).  ? ?I connected withNAME@ on 08/12/21 at  4:30 PM EDT by a video enabled telemedicine application and verified that I am speaking with the correct person using two identifiers. ?Location patient: home ?Location provider: work or home office ?Persons participating in the virtual visit: patient, provider ? ?I discussed the limitations, risks, security and privacy concerns of performing an evaluation and management service by telephone and the availability of in person appointments. I also discussed with the patient that there may be a patient responsible charge related to this service. The patient expressed understanding and agreed to proceed. ? ?Reason for visit: follow up on hypertension , prediabetes and overweight ? ?HPI: ? ?1) HTN: home readings have been 267-124 systolic / 74  to 83 diastolic  on hctz alone.  Has not started the potassium yet.  Microalbuminuria seen in September has resolved.   ? ?2) Treated via virtual visit on March 9 for hives with prednisone taper .  Patient self treated with zyrtec which helped.  Now resolved.  Thinks it may have been COVID related since her travelling companion (her son) became infected  with COVID during their trip to [place]  ? ?3) prediabetes:  a1c stable at 5.9  checking fasting BS daily all under 125 except during her hives reaction  ? ?ROS: See pertinent positives and negatives per HPI. ? ?Past Medical History:  ?Diagnosis Date  ? History of migraine headaches   ? Hyperlipidemia   ? Hypertension   ? ? ?Past Surgical History:  ?Procedure Laterality Date  ?  CHOLECYSTECTOMY  1984  ? ? ?Family History  ?Problem Relation Age of Onset  ? Mental illness Mother   ?     bipolar disorder  ? Diabetes Father   ? Cancer Father 15  ?     prostate Ca  ? Diabetes Paternal Aunt   ? Breast cancer Neg Hx   ? ? ?SOCIAL HX:  reports that she quit smoking about 49 years ago. Her smoking use included cigarettes. She has never used smokeless tobacco. She reports current alcohol use. She reports that she does not use drugs.  ? ? ?Current Outpatient Medications:  ?  cetirizine (ZYRTEC) 10 MG tablet, Take 10 mg by mouth daily., Disp: , Rfl:  ?  hydrochlorothiazide (HYDRODIURIL) 25 MG tablet, Take 1 tablet (25 mg total) by mouth daily., Disp: 90 tablet, Rfl: 1 ?  potassium chloride SA (KLOR-CON M) 20 MEQ tablet, Take 1 tablet (20 mEq total) by mouth 2 (two) times daily. For 3 days , then as needed (Patient not taking: Reported on 08/12/2021), Disp: 30 tablet, Rfl: 0 ? ?EXAM: ? ?VITALS per patient if applicable: ? ?GENERAL: alert, oriented, appears well and in no acute distress ? ?HEENT: atraumatic, conjunttiva clear, no obvious abnormalities on inspection of external nose and ears ? ?NECK: normal movements of the head and neck ? ?LUNGS: on inspection no signs of respiratory distress, breathing rate appears normal, no obvious gross SOB, gasping or wheezing ? ?CV: no obvious cyanosis ? ?MS: moves all visible extremities without noticeable abnormality ? ?PSYCH/NEURO: pleasant and cooperative, no obvious depression or  anxiety, speech and thought processing grossly intact ? ?ASSESSMENT AND PLAN: ? ?Discussed the following assessment and plan: ? ?Encounter for screening mammogram for malignant neoplasm of breast - Plan: MM 3D SCREEN BREAST BILATERAL ? ?Moderate mixed hyperlipidemia not requiring statin therapy - Plan: Lipid Panel w/reflex Direct LDL ? ?Hypokalemia - Plan: Comprehensive metabolic panel ? ?Overweight (BMI 25.0-29.9) ? ?Prediabetes ? ?History of migraine headaches ? ?Primary  hypertension ? ?Overweight (BMI 25.0-29.9) ?She reports a weight gain of 10 lbs due to travel and lack of exercise. I have addressed  BMI and recommended wt loss of 10% of body weight over the next 6 months using a low fat, fruit/vegetable based [place]iter[place] diet and regular exercise a minimum of 5 days per week.  ? ? ?Prediabetes ?a1c is stable at 5.9.  She is planning to resume diet and exercise after her next trip to [place] in May  ? ?Hyperlipidemia ? She has lowered her LDL with diet,  Cardiac CT was done and her calcium score was zero. No therapy indicated   Repeat lipids next month per patient request.  ? ?Lab Results  ?Component Value Date  ? CHOL 219 (H) 02/04/2021  ? HDL 56.40 02/04/2021  ? LDLCALC 141 (H) 02/04/2021  ? LDLDIRECT 113.0 08/22/2015  ? TRIG 109.0 02/04/2021  ? CHOLHDL 4 02/04/2021  ? ? ? ?History of migraine headaches ?Treated in the past with propanolol ? ? ?Hypertension ?Reasonable control on hct but having recurrent hypokalemia .  Will consider change in medication if she is hypokalemic upon return  ? ?  ?I discussed the assessment and treatment plan with the patient. The patient was provided an opportunity to ask questions and all were answered. The patient agreed with the plan and demonstrated an understanding of the instructions. ?  ?The patient was advised to call back or seek an in-person evaluation if the symptoms worsen or if the condition fails to improve as anticipated. ? ? ?I spent 30 minutes dedicated to the care of this patient on the date of this encounter to include pre-visit review of his medical history,  Face-to-face time with the patient , and post visit ordering of testing and therapeutics.  ? ? ?Crecencio Mc, MD   ?

## 2021-08-12 NOTE — Assessment & Plan Note (Signed)
She reports a weight gain of 10 lbs due to travel and lack of exercise. I have addressed  BMI and recommended wt loss of 10% of body weight over the next 6 months using a low fat, fruit/vegetable based [place] diet and regular exercise a minimum of 5 days per week.  ? ?

## 2021-12-09 ENCOUNTER — Ambulatory Visit: Payer: Medicare Other

## 2021-12-15 ENCOUNTER — Telehealth: Payer: Self-pay | Admitting: Internal Medicine

## 2021-12-15 NOTE — Telephone Encounter (Signed)
Copied from [place] [id number]. Topic: Medicare AWV >> Dec 15, 2021  2:18 PM Devoria Glassing wrote: Reason for CRM: Left message for patient to schedule Annual Wellness Visit.  Please schedule with Nurse Health Advisor Denisa O'Brien-Blaney, LPN at [facility]. This appt can be telephone or office visit.  Please call [phone] ask for [facility]

## 2021-12-17 ENCOUNTER — Ambulatory Visit (INDEPENDENT_AMBULATORY_CARE_PROVIDER_SITE_OTHER): Payer: Medicare Other

## 2021-12-17 VITALS — Ht 62.0 in | Wt 165.0 lb

## 2021-12-17 DIAGNOSIS — Z0000 Encounter for general adult medical examination without abnormal findings: Secondary | ICD-10-CM

## 2021-12-17 NOTE — Patient Instructions (Addendum)
  Alexandria Graves , Thank you for taking time to come for your Medicare Wellness Visit. I appreciate your ongoing commitment to your health goals. Please review the following plan we discussed and let me know if I can assist you in the future.   These are the goals we discussed:  Goals      Weight (lb) < 130 lb (59 kg)     Stay active  Healthy diet        This is a list of the screening recommended for you and due dates:  Health Maintenance  Topic Date Due   Mammogram  06/24/2021   Flu Shot  12/16/2021   COVID-19 Vaccine (3 - Pfizer series) 01/02/2022*   Tetanus Vaccine  11/17/2023   Colon Cancer Screening  12/26/2023   DEXA scan (bone density measurement)  Completed   Hepatitis C Screening: USPSTF Recommendation to screen - Ages 65-79 yo.  Completed   HPV Vaccine  Aged Out   Pneumonia Vaccine  Discontinued   Zoster (Shingles) Vaccine  Discontinued  *Topic was postponed. The date shown is not the original due date.

## 2021-12-17 NOTE — Progress Notes (Addendum)
Subjective:   Alexandria Graves is a 71 y.o. female who presents for an Initial Medicare Annual Wellness Visit.  Review of Systems    No ROS.  Medicare Wellness Virtual Visit.  Visual/audio telehealth visit, UTA vital signs.   See social history for additional risk factors.   Cardiac Risk Factors include: advanced age (>76mn, >>41women);hypertension     Objective:    Today's Vitals   12/17/21 1302  Weight: 165 lb (74.8 kg)  Height: '5\' 2"'$  (1.575 m)   Body mass index is 30.18 kg/m.     12/17/2021    1:11 PM 04/17/2018    8:49 AM  Advanced Directives  Does Patient Have a Medical Advance Directive? Yes Yes  Type of AParamedicof AMelletteLiving will HTitanicLiving will  Does patient want to make changes to medical advance directive? No - Patient declined   Copy of [place] Chart? No - copy requested     Current Medications (verified) Outpatient Encounter Medications as of 12/17/2021  Medication Sig   cetirizine (ZYRTEC) 10 MG tablet Take 10 mg by mouth daily.   hydrochlorothiazide (HYDRODIURIL) 25 MG tablet Take 1 tablet (25 mg total) by mouth daily.   potassium chloride SA (KLOR-CON M) 20 MEQ tablet Take 1 tablet (20 mEq total) by mouth 2 (two) times daily. For 3 days , then as needed (Patient not taking: Reported on 08/12/2021)   No facility-administered encounter medications on file as of 12/17/2021.    Allergies (verified) Patient has no known allergies.   History: Past Medical History:  Diagnosis Date   History of migraine headaches    Hyperlipidemia    Hypertension    Past Surgical History:  Procedure Laterality Date   CHOLECYSTECTOMY  1984   Family History  Problem Relation Age of Onset   Mental illness Mother        bipolar disorder   Diabetes Father    Cancer Father 82      prostate Ca   Diabetes Paternal Aunt    Breast cancer Neg Hx    Social History   Socioeconomic History    Marital status: Married    Spouse name: Not on file   Number of children: Not on file   Years of education: Not on file   Highest education level: Not on file  Occupational History   Not on file  Tobacco Use   Smoking status: Former    Types: Cigarettes    Quit date: 08/26/1971    Years since quitting: 50.3   Smokeless tobacco: Never  Substance and Sexual Activity   Alcohol use: Yes    Comment: social   Drug use: No   Sexual activity: Yes  Other Topics Concern   Not on file  Social History Narrative   Not on file   Social Determinants of Health   Financial Resource Strain: Low Risk  (12/17/2021)   Overall Financial Resource Strain (CARDIA)    Difficulty of Paying Living Expenses: Not hard at all  Food Insecurity: No Food Insecurity (12/17/2021)   Hunger Vital Sign    Worried About Running Out of Food in the Last Year: Never true    Ran Out of Food in the Last Year: Never true  Transportation Needs: No Transportation Needs (12/17/2021)   PRAPARE - THydrologist(Medical): No    Lack of Transportation (Non-Medical): No  Physical Activity: Not on file  Stress: No Stress Concern Present (12/17/2021)   [place]    Feeling of Stress : Not at all  Social Connections: Unknown (12/17/2021)   Social Connection and Isolation Panel [NHANES]    Frequency of Communication with Friends and Family: Not on file    Frequency of Social Gatherings with Friends and Family: Not on file    Attends Religious Services: Not on Advertising copywriter or Organizations: Not on file    Attends Archivist Meetings: Not on file    Marital Status: Married    Tobacco Counseling Counseling given: Not Answered   Clinical Intake:  Pre-visit preparation completed: Yes        Diabetes: No  How often do you need to have someone help you when you read instructions, pamphlets, or other written  materials from your doctor or pharmacy?: 1 - Never  Interpreter Needed?: No      Activities of Daily Living    12/17/2021    1:10 PM  In your present state of health, do you have any difficulty performing the following activities:  Hearing? 0  Vision? 0  Difficulty concentrating or making decisions? 0  Walking or climbing stairs? 0  Dressing or bathing? 0  Doing errands, shopping? 0  Preparing Food and eating ? N  Using the Toilet? N  In the past six months, have you accidently leaked urine? N  Do you have problems with loss of bowel control? N  Managing your Medications? N  Managing your Finances? N  Housekeeping or managing your Housekeeping? N    Patient Care Team: Crecencio Mc, MD as PCP - General (Internal Medicine)  Indicate any recent Medical Services you may have received from other than [facility] providers in the past year (date may be approximate).     Assessment:   This is a routine wellness examination for [facility].  Virtual Visit via Telephone Note  I connected with  Alexandria Graves on 12/17/21 at  1:00 PM EDT by telephone and verified that I am speaking with the correct person using two identifiers.  Location: Patient: home  Provider: office Persons participating in the virtual visit: patient/Nurse Health Advisor   I discussed the limitations of performing an evaluation and management service by telehealth. We continued and completed visit with audio only. Some vital signs may be absent or patient reported.   Hearing/Vision screen Hearing Screening - Comments:: Patient is able to hear conversational tones without difficulty.  No issues reported. Vision Screening - Comments:: Followed by [facility] Wears corrective lenses They have seen their ophthalmologist in the last 12 months.   Dietary issues and exercise activities discussed: Current Exercise Habits: Home exercise routine, Intensity: Mild Healthy diet Good water intake   Goals Addressed              This Visit's Progress    Weight (lb) < 130 lb (59 kg)   165 lb (74.8 kg)    Stay active  Healthy diet       Depression Screen    12/17/2021    1:09 PM 02/28/2021   11:34 AM 12/18/2020    1:51 PM 06/12/2020    2:36 PM 09/15/2017    3:35 PM 03/11/2012    6:07 AM 03/09/2012    8:44 AM  PHQ 2/9 Scores  PHQ - 2 Score 0 0 0 0 0 0 0  PHQ- 9 Score  0      Fall Risk    12/17/2021    1:09 PM 02/28/2021   11:34 AM 12/18/2020    1:51 PM 06/12/2020    2:36 PM 03/21/2019    1:33 PM  [place] in the past year? 0 0 0 0 0  Number falls in past yr: 0 0 0    Injury with Fall? 0 0 0    Risk for fall due to : No Fall Risks      Follow up Falls evaluation completed Falls evaluation completed Falls evaluation completed Falls evaluation completed Falls evaluation completed    [place]: Home free of loose throw rugs in walkways, pet beds, electrical cords, etc? Yes  Adequate lighting in your home to reduce risk of falls? Yes   ASSISTIVE DEVICES UTILIZED TO PREVENT FALLS: Life alert? No  Use of a cane, walker or w/c? No   TIMED UP AND GO: Was the test performed? No .   Cognitive Function:  Patient is alert and oriented x3.  Enjoys brain health stimulating activities with family. Denies difficulty focusing, making decisions and memory loss.       Immunizations Immunization History  Administered Date(s) Administered   PFIZER(Purple Top)SARS-COV-2 Vaccination 09/30/2019, 10/22/2019   Tdap 02/24/2013, 11/16/2013   Zoster/PNA vaccine discontinued per patient.   Screening Tests Health Maintenance  Topic Date Due   MAMMOGRAM  06/24/2021   INFLUENZA VACCINE  12/16/2021   COVID-19 Vaccine (3 - Pfizer series) 01/02/2022 (Originally 12/17/2019)   TETANUS/TDAP  11/17/2023   COLONOSCOPY (Pts 45-50yr Insurance coverage will need to be confirmed)  12/26/2023   DEXA SCAN  Completed   Hepatitis C Screening  Completed   HPV VACCINES  Aged  Out   Pneumonia Vaccine 71 Years old  Discontinued   Zoster Vaccines- Shingrix  Discontinued   Health Maintenance Health Maintenance Due  Topic Date Due   MAMMOGRAM  06/24/2021   INFLUENZA VACCINE  12/16/2021   Mammogram- scheduled 12/22/21.   Lung Cancer Screening: (Low Dose CT Chest recommended if Age 69-80years, 30 pack-year currently smoking OR have quit w/in 15years.) does not qualify.   Vision Screening: Recommended annual ophthalmology exams for early detection of glaucoma and other disorders of the eye.  Dental Screening: Recommended annual dental exams for proper oral hygiene  Community Resource Referral / Chronic Care Management: CRR required this visit?  No   CCM required this visit?  No      Plan:   Keep all routine maintenance appointments.   I have personally reviewed and noted the following in the patient's chart:   Medical and social history Use of alcohol, tobacco or illicit drugs  Current medications and supplements including opioid prescriptions. Patient is not currently taking opioid prescriptions. Functional ability and status Nutritional status Physical activity Advanced directives List of other physicians Hospitalizations, surgeries, and ER visits in previous 12 months Vitals Screenings to include cognitive, depression, and falls Referrals and appointments  In addition, I have reviewed and discussed with patient certain preventive protocols, quality metrics, and best practice recommendations. A written personalized care plan for preventive services as well as general preventive health recommendations were provided to patient.     OBrien-Blaney, Karan Ramnauth L, LPN   [id number]    I have reviewed the above information and agree with above.   TDeborra Medina MD

## 2021-12-22 ENCOUNTER — Inpatient Hospital Stay: Admission: RE | Admit: 2021-12-22 | Payer: Medicare Other | Source: Ambulatory Visit

## 2021-12-23 ENCOUNTER — Ambulatory Visit
Admission: RE | Admit: 2021-12-23 | Discharge: 2021-12-23 | Disposition: A | Discharge status: Home / Self Care | Payer: Medicare Other | Source: Ambulatory Visit | Attending: Internal Medicine | Admitting: Internal Medicine

## 2021-12-23 DIAGNOSIS — Z1231 Encounter for screening mammogram for malignant neoplasm of breast: Secondary | ICD-10-CM | POA: Insufficient documentation

## 2023-02-22 ENCOUNTER — Telehealth: Payer: Self-pay | Admitting: *Deleted

## 2023-02-22 ENCOUNTER — Ambulatory Visit: Payer: Medicare Other | Admitting: *Deleted

## 2023-02-22 VITALS — BP 143/81 | HR 65 | Ht 62.0 in | Wt 165.5 lb

## 2023-02-22 DIAGNOSIS — Z0000 Encounter for general adult medical examination without abnormal findings: Secondary | ICD-10-CM | POA: Diagnosis not present

## 2023-02-22 DIAGNOSIS — E782 Mixed hyperlipidemia: Secondary | ICD-10-CM

## 2023-02-22 DIAGNOSIS — I10 Essential (primary) hypertension: Secondary | ICD-10-CM

## 2023-02-22 DIAGNOSIS — R7303 Prediabetes: Secondary | ICD-10-CM

## 2023-02-22 NOTE — Patient Instructions (Signed)
Ms. Namba , Thank you for taking time to come for your Medicare Wellness Visit. I appreciate your ongoing commitment to your health goals. Please review the following plan we discussed and let me know if I can assist you in the future.   Referrals/Orders/Follow-Ups/Clinician Recommendations: Vaccines discussed  This is a list of the screening recommended for you and due dates:  Health Maintenance  Topic Date Due   COVID-19 Vaccine (3 - 2023-24 season) 01/17/2023   Flu Shot  08/16/2023*   Mammogram  02/04/2024*   DTaP/Tdap/Td vaccine (3 - Td or Tdap) 11/17/2023   Colon Cancer Screening  12/26/2023   Medicare Annual Wellness Visit  02/22/2024   DEXA scan (bone density measurement)  Completed   Hepatitis C Screening  Completed   HPV Vaccine  Aged Out   Pneumonia Vaccine  Discontinued   Zoster (Shingles) Vaccine  Discontinued  *Topic was postponed. The date shown is not the original due date.    Advanced directives: (Copy Requested) Please bring a copy of your health care power of attorney and living will to the office to be added to your chart at your convenience.  Next Medicare Annual Wellness Visit scheduled for next year: Yes 02/28/24 @ 3:00

## 2023-02-22 NOTE — Telephone Encounter (Signed)
LMTCB

## 2023-02-22 NOTE — Telephone Encounter (Signed)
FYI Called patient to perform her AWV. Patient's blood pressure was 145/77 heart rate 56, then BP 143/81. When reviewing patient's medications she advised that she no longer is taking her hydrochlorothiazide or potasium. Patient has an upcoming virtual visit with Dr. Darrick Huntsman 04/13/23 at 1:30.

## 2023-02-22 NOTE — Progress Notes (Signed)
Subjective:   Alexandria Graves is a 72 y.o. female who presents for Medicare Annual (Subsequent) preventive examination.  Visit Complete: Virtual  I connected with  Felisa Bonier on 02/22/23 by a audio enabled telemedicine application and verified that I am speaking with the correct person using two identifiers.  Patient Location: Home  Provider Location: Office/Clinic  I discussed the limitations of evaluation and management by telemedicine. The patient expressed understanding and agreed to proceed.  Because this visit was a virtual/telehealth visit, some criteria may be missing or patient reported. Any vitals not documented were not able to be obtained and vitals that have been documented are patient reported.   Patient provided some vital signs which has been documented.  .Cardiac Risk Factors include: advanced age (>44men, >100 women);dyslipidemia;hypertension     Objective:    Today's Vitals   02/22/23 1238 02/22/23 1241  BP: (!) 145/77 (!) 143/81  Pulse: (!) 56 65  Weight: 165 lb 8 oz (75.1 kg)   Height: 5\' 2"  (1.575 m)    Body mass index is 30.27 kg/m.     02/22/2023   12:57 PM 12/17/2021    1:11 PM 04/17/2018    8:49 AM  Advanced Directives  Does Patient Have a Medical Advance Directive? Yes Yes Yes  Type of Estate agent of [place];Living will Healthcare Power of [place];Living will Healthcare Power of [place];Living will  Does patient want to make changes to medical advance directive?  No - Patient declined   Copy of Healthcare Power of Attorney in Chart? No - copy requested No - copy requested     Current Medications (verified) Outpatient Encounter Medications as of 02/22/2023  Medication Sig   cetirizine (ZYRTEC) 10 MG tablet Take 10 mg by mouth daily.   hydrochlorothiazide (HYDRODIURIL) 25 MG tablet Take 1 tablet (25 mg total) by mouth daily. (Patient not taking: Reported on 02/22/2023)   potassium chloride SA (KLOR-CON M) 20 MEQ tablet  Take 1 tablet (20 mEq total) by mouth 2 (two) times daily. For 3 days , then as needed (Patient not taking: Reported on 02/22/2023)   No facility-administered encounter medications on file as of 02/22/2023.    Allergies (verified) Patient has no known allergies.   History: Past Medical History:  Diagnosis Date   History of migraine headaches    Hyperlipidemia    Hypertension    Past Surgical History:  Procedure Laterality Date   CHOLECYSTECTOMY  1984   Family History  Problem Relation Age of Onset   Mental illness Mother        bipolar disorder   Diabetes Father    Cancer Father 63       prostate Ca   Diabetes Paternal Aunt    Breast cancer Neg Hx    Social History   Socioeconomic History   Marital status: Married    Spouse name: Not on file   Number of children: Not on file   Years of education: Not on file   Highest education level: Not on file  Occupational History   Not on file  Tobacco Use   Smoking status: Former    Current packs/day: 0.00    Types: Cigarettes    Quit date: 08/26/1971    Years since quitting: 51.5   Smokeless tobacco: Never  Substance and Sexual Activity   Alcohol use: Yes    Comment: social   Drug use: No   Sexual activity: Yes  Other Topics Concern   Not on  file  Social History Narrative   married   Social Determinants of Health   Financial Resource Strain: Low Risk  (02/22/2023)   Overall Financial Resource Strain (CARDIA)    Difficulty of Paying Living Expenses: Not hard at all  Food Insecurity: No Food Insecurity (02/22/2023)   Hunger Vital Sign    Worried About Running Out of Food in the Last Year: Never true    Ran Out of Food in the Last Year: Never true  Transportation Needs: No Transportation Needs (02/22/2023)   PRAPARE - Administrator, Civil Service (Medical): No    Lack of Transportation (Non-Medical): No  Physical Activity: Sufficiently Active (02/22/2023)   Exercise Vital Sign    Days of Exercise per  Week: 6 days    Minutes of Exercise per Session: 90 min  Stress: No Stress Concern Present (02/22/2023)   [employer] of Occupational Health - Occupational Stress Questionnaire    Feeling of Stress : Only a little  Social Connections: Moderately Integrated (02/22/2023)   Social Connection and Isolation Panel [NHANES]    Frequency of Communication with Friends and Family: More than three times a week    Frequency of Social Gatherings with Friends and Family: More than three times a week    Attends Religious Services: Never    Database administrator or Organizations: Yes    Attends Engineer, structural: More than 4 times per year    Marital Status: Married    Tobacco Counseling Counseling given: Not Answered   Clinical Intake:  Pre-visit preparation completed: Yes  Pain : No/denies pain     BMI - recorded: 30.27 Nutritional Status: BMI > 30  Obese Nutritional Risks: None Diabetes: No  How often do you need to have someone help you when you read instructions, pamphlets, or other written materials from your doctor or pharmacy?: 1 - Never  Interpreter Needed?: No  Information entered by :: R. Ahmari Duerson LPN   Activities of Daily Living    02/22/2023   12:44 PM  In your present state of health, do you have any difficulty performing the following activities:  Hearing? 0  Vision? 0  Comment glasses  Difficulty concentrating or making decisions? 0  Walking or climbing stairs? 0  Dressing or bathing? 0  Doing errands, shopping? 0  Preparing Food and eating ? N  Using the Toilet? N  In the past six months, have you accidently leaked urine? N  Do you have problems with loss of bowel control? N  Managing your Medications? N  Managing your Finances? N  Housekeeping or managing your Housekeeping? N    Patient Care Team: Sherlene Shams, MD as PCP - General (Internal Medicine)  Indicate any recent Medical Services you may have received from other than [facility]  providers in the past year (date may be approximate).     Assessment:   This is a routine wellness examination for [facility].  Hearing/Vision screen Hearing Screening - Comments:: No issues Vision Screening - Comments:: glasses   Goals Addressed             This Visit's Progress    Patient Stated       Wants to lose weight and control blood pressure       Depression Screen    02/22/2023   12:52 PM 12/17/2021    1:09 PM 02/28/2021   11:34 AM 12/18/2020    1:51 PM 06/12/2020    2:36 PM 09/15/2017  3:35 PM 03/11/2012    6:07 AM  PHQ 2/9 Scores  PHQ - 2 Score 0 0 0 0 0 0 0  PHQ- 9 Score 0     0     Fall Risk    02/22/2023   12:45 PM 12/17/2021    1:09 PM 02/28/2021   11:34 AM 12/18/2020    1:51 PM 06/12/2020    2:36 PM  Fall Risk   Falls in the past year? 0 0 0 0 0  Number falls in past yr: 0 0 0 0   Injury with Fall? 0 0 0 0   Risk for fall due to : No Fall Risks No Fall Risks     Follow up Falls prevention discussed;Falls evaluation completed Falls evaluation completed Falls evaluation completed Falls evaluation completed Falls evaluation completed    MEDICARE RISK AT HOME: Medicare Risk at Home Any stairs in or around the home?: Yes If so, are there any without handrails?: No Home free of loose throw rugs in walkways, pet beds, electrical cords, etc?: Yes Adequate lighting in your home to reduce risk of falls?: Yes Life alert?: No Use of a cane, walker or w/c?: No Grab bars in the bathroom?: Yes Shower chair or bench in shower?: Yes Elevated toilet seat or a handicapped toilet?: Yes   Cognitive Function:        02/22/2023   12:58 PM  6CIT Screen  What Year? 0 points  What month? 0 points  What time? 0 points  Count back from 20 0 points  Months in reverse 0 points  Repeat phrase 0 points  Total Score 0 points    Immunizations Immunization History  Administered Date(s) Administered   PFIZER(Purple Top)SARS-COV-2 Vaccination 09/30/2019, 10/22/2019    Tdap 02/24/2013, 11/16/2013    TDAP status: Up to date  Flu Vaccine status: Declined, Education has been provided regarding the importance of this vaccine but patient still declined. Advised may receive this vaccine at local pharmacy or Health Dept. Aware to provide a copy of the vaccination record if obtained from local pharmacy or Health Dept. Verbalized acceptance and understanding.  Pneumococcal vaccine status: Declined,  Education has been provided regarding the importance of this vaccine but patient still declined. Advised may receive this vaccine at local pharmacy or Health Dept. Aware to provide a copy of the vaccination record if obtained from local pharmacy or Health Dept. Verbalized acceptance and understanding.   Covid-19 vaccine status: Information provided on how to obtain vaccines.   Qualifies for Shingles Vaccine? Yes   Zostavax completed No   Shingrix Completed?: No.    Education has been provided regarding the importance of this vaccine. Patient has been advised to call insurance company to determine out of pocket expense if they have not yet received this vaccine. Advised may also receive vaccine at local pharmacy or Health Dept. Verbalized acceptance and understanding.  Screening Tests Health Maintenance  Topic Date Due   Medicare Annual Wellness (AWV)  12/18/2022   COVID-19 Vaccine (3 - 2023-24 season) 01/17/2023   INFLUENZA VACCINE  08/16/2023 (Originally 12/17/2022)   MAMMOGRAM  02/04/2024 (Originally 12/24/2022)   DTaP/Tdap/Td (3 - Td or Tdap) 11/17/2023   Colonoscopy  12/26/2023   DEXA SCAN  Completed   Hepatitis C Screening  Completed   HPV VACCINES  Aged Out   Pneumonia Vaccine 38+ Years old  Discontinued   Zoster Vaccines- Shingrix  Discontinued    Health Maintenance  Health Maintenance Due  Topic Date Due  Medicare Annual Wellness (AWV)  12/18/2022   COVID-19 Vaccine (3 - 2023-24 season) 01/17/2023    Colorectal cancer screening: Type of screening:  Colonoscopy. Completed 12/2013. Repeat every 10 years  Mammogram status: Completed 12/2021. Repeat every year Will discuss with PCP at next visit  Bone Density status: Completed 10/2017. Results reflect: Bone density results: NORMAL. Repeat every 2 years. Will discuss with PCP at next visit  Lung Cancer Screening: (Low Dose CT Chest recommended if Age 91-80 years, 20 pack-year currently smoking OR have quit w/in 15years.) does not qualify.     Additional Screening:  Hepatitis C Screening: does qualify; Completed 08/2015  Vision Screening: Recommended annual ophthalmology exams for early detection of glaucoma and other disorders of the eye. Is the patient up to date with their annual eye exam?  Yes  Who is the provider or what is the name of the office in which the patient attends annual eye exams? [facility]/ [place] If pt is not established with a provider, would they like to be referred to a provider to establish care? No .   Dental Screening: Recommended annual dental exams for proper oral hygiene    Community Resource Referral / Chronic Care Management: CRR required this visit?  No   CCM required this visit?  No     Plan:     I have personally reviewed and noted the following in the patient's chart:   Medical and social history Use of alcohol, tobacco or illicit drugs  Current medications and supplements including opioid prescriptions. Patient is not currently taking opioid prescriptions. Functional ability and status Nutritional status Physical activity Advanced directives List of other physicians Hospitalizations, surgeries, and ER visits in previous 12 months Vitals Screenings to include cognitive, depression, and falls Referrals and appointments  In addition, I have reviewed and discussed with patient certain preventive protocols, quality metrics, and best practice recommendations. A written personalized care plan for preventive services as well as general  preventive health recommendations were provided to patient.     Sydell Axon, LPN   93/12/1015   After Visit Summary: (MyChart) Due to this being a telephonic visit, the after visit summary with patients personalized plan was offered to patient via MyChart   Nurse Notes: Note sent to PCP regarding blood pressure

## 2023-02-25 MED ORDER — HYDROCHLOROTHIAZIDE 25 MG PO TABS: 25.0000 mg | ORAL_TABLET | Freq: Every day | ORAL | 0 refills | Status: DC

## 2023-02-25 MED ORDER — POTASSIUM CHLORIDE CRYS ER 20 MEQ PO TBCR: 20.0000 meq | EXTENDED_RELEASE_TABLET | Freq: Two times a day (BID) | ORAL | 0 refills | Status: DC

## 2023-02-25 NOTE — Telephone Encounter (Signed)
Medication has been sent to [facility]. However pt will not be able to start taking until 03/25/2023.

## 2023-02-25 NOTE — Telephone Encounter (Signed)
LMTCB

## 2023-02-25 NOTE — Telephone Encounter (Signed)
Patient called back and note was read. Patient states she will need medication refills because her medication has expired. She is leaving to go out of town and will not be available until Nov the 7th to start taking medication for a week and have labs.  Pharmacy is [facility] in [place].

## 2023-02-26 NOTE — Telephone Encounter (Signed)
noted 

## 2023-03-04 ENCOUNTER — Other Ambulatory Visit: Payer: Self-pay

## 2023-03-04 MED ORDER — HYDROCHLOROTHIAZIDE 25 MG PO TABS: 25.0000 mg | ORAL_TABLET | Freq: Every day | ORAL | 0 refills | Status: DC

## 2023-04-06 ENCOUNTER — Other Ambulatory Visit (INDEPENDENT_AMBULATORY_CARE_PROVIDER_SITE_OTHER): Payer: Medicare Other

## 2023-04-06 DIAGNOSIS — E782 Mixed hyperlipidemia: Secondary | ICD-10-CM

## 2023-04-06 DIAGNOSIS — I10 Essential (primary) hypertension: Secondary | ICD-10-CM | POA: Diagnosis not present

## 2023-04-06 DIAGNOSIS — R7303 Prediabetes: Secondary | ICD-10-CM

## 2023-04-07 LAB — COMPREHENSIVE METABOLIC PANEL
ALT: 17 U/L (ref 0–35)
AST: 17 U/L (ref 0–37)
Albumin: 4.5 g/dL (ref 3.5–5.2)
Alkaline Phosphatase: 75 U/L (ref 39–117)
BUN: 16 mg/dL (ref 6–23)
CO2: 28 meq/L (ref 19–32)
Calcium: 9.5 mg/dL (ref 8.4–10.5)
Chloride: 99 meq/L (ref 96–112)
Creatinine, Ser: 0.85 mg/dL (ref 0.40–1.20)
GFR: 69.31 mL/min (ref >60.00)
Glucose, Bld: 80 mg/dL (ref 70–99)
Potassium: 4.3 meq/L (ref 3.5–5.1)
Sodium: 137 meq/L (ref 135–145)
Total Bilirubin: 0.7 mg/dL (ref 0.2–1.2)
Total Protein: 7.2 g/dL (ref 6.0–8.3)

## 2023-04-07 LAB — LIPID PANEL W/REFLEX DIRECT LDL
Cholesterol: 244 mg/dL — ABNORMAL HIGH (ref <200)
HDL: 61 mg/dL (ref >=50)
LDL Cholesterol (Calc): 168 mg/dL — ABNORMAL HIGH
Non-HDL Cholesterol (Calc): 183 mg/dL — ABNORMAL HIGH (ref <130)
Total CHOL/HDL Ratio: 4 (calc) (ref <5.0)
Triglycerides: 62 mg/dL (ref <150)

## 2023-04-07 LAB — HEMOGLOBIN A1C: Hgb A1c MFr Bld: 5.6 % (ref 4.6–6.5)

## 2023-04-07 LAB — MICROALBUMIN / CREATININE URINE RATIO
Creatinine,U: 155.4 mg/dL
Microalb Creat Ratio: 2.5 mg/g (ref 0.0–30.0)
Microalb, Ur: 3.8 mg/dL — ABNORMAL HIGH (ref 0.0–1.9)

## 2023-04-13 ENCOUNTER — Telehealth: Payer: Medicare Other | Admitting: Internal Medicine

## 2023-04-13 ENCOUNTER — Encounter: Payer: Self-pay | Admitting: Internal Medicine

## 2023-04-13 VITALS — BP 146/80 | Ht 62.0 in | Wt 162.1 lb

## 2023-04-13 DIAGNOSIS — E663 Overweight: Secondary | ICD-10-CM | POA: Diagnosis not present

## 2023-04-13 DIAGNOSIS — I10 Essential (primary) hypertension: Secondary | ICD-10-CM

## 2023-04-13 DIAGNOSIS — R7303 Prediabetes: Secondary | ICD-10-CM | POA: Diagnosis not present

## 2023-04-13 MED ORDER — TELMISARTAN 40 MG PO TABS: 40.0000 mg | ORAL_TABLET | Freq: Every day | ORAL | 0 refills | Status: DC

## 2023-04-13 NOTE — Assessment & Plan Note (Signed)
She has been using a Dexcom CBG to monitor blood sugars and notes an average blood sugar of 110  Lab Results  Component Value Date   HGBA1C 5.6 04/06/2023

## 2023-04-13 NOTE — Assessment & Plan Note (Addendum)
Not at goal and now with microalbuminuria.  Stopping hydrochlorothiazide and starting telmisartan 40 mg at bedtime .  Return for assessment of renal function and potassium after one week

## 2023-04-13 NOTE — Progress Notes (Signed)
Virtual Visit via Caregility   Note   This format is felt to be most appropriate for this patient at this time.  All issues noted in this document were discussed and addressed.  No physical exam was performed (except for noted visual exam findings with Video Visits).   I connected with Alexandria Graves on 04/13/23 at  1:30 PM EST by a video enabled telemedicine applications and verified that I am speaking with the correct person using two identifiers. Location patient: home Location provider: work or home office Persons participating in the virtual visit: patient, provider  I discussed the limitations, risks, security and privacy concerns of performing an evaluation and management service by telephone and the availability of in person appointments. I also discussed with the patient that there may be a patient responsible charge related to this service. The patient expressed understanding and agreed to proceed.  Reason for visit: uncontrolled hypertension   HPI:   72 yr old female with history  of overweight , impaired fasting glucose prediabetes and hypertension last seen in office for OV in 2020 (has had virtual visits since then)   HTN:  home readings have been > 140/80  on hydrochlorothiazide.  Feels dehydrate all the time  Weight gain :  stopped exercising  due to the heat. plans to resume the "[employer] Hard"   PROGRAM after the holidays    ROS: See pertinent positives and negatives per HPI.  Past Medical History:  Diagnosis Date   History of migraine headaches    Hyperlipidemia    Hypertension     Past Surgical History:  Procedure Laterality Date   CHOLECYSTECTOMY  1984    Family History  Problem Relation Age of Onset   Mental illness Mother        bipolar disorder   Diabetes Father    Cancer Father 52       prostate Ca   Diabetes Paternal Aunt    Breast cancer Neg Hx     SOCIAL HX:  reports that she quit smoking about 51 years ago. Her smoking use included cigarettes. She has  never used smokeless tobacco. She reports current alcohol use. She reports that she does not use drugs.    Current Outpatient Medications:    cetirizine (ZYRTEC) 10 MG tablet, Take 10 mg by mouth daily., Disp: , Rfl:    telmisartan (MICARDIS) 40 MG tablet, Take 1 tablet (40 mg total) by mouth at bedtime., Disp: 90 tablet, Rfl: 0  EXAM:  VITALS per patient if applicable:  GENERAL: alert, oriented, appears well and in no acute distress  HEENT: atraumatic, conjunttiva clear, no obvious abnormalities on inspection of external nose and ears  NECK: normal movements of the head and neck  LUNGS: on inspection no signs of respiratory distress, breathing rate appears normal, no obvious gross SOB, gasping or wheezing  CV: no obvious cyanosis  MS: moves all visible extremities without noticeable abnormality  PSYCH/NEURO: pleasant and cooperative, no obvious depression or anxiety, speech and thought processing grossly intact  ASSESSMENT AND PLAN: Primary hypertension Assessment & Plan: Not at goal and now with microalbuminuria.  Stopping hydrochlorothiazide and starting telmisartan 40 mg at bedtime .  Return for assessment of renal function and potassium after one week   Orders: -     Basic metabolic panel; Future  Overweight (BMI 25.0-29.9) Assessment & Plan: She reports a weight gain  due to lack of exercise. I have addressed  BMI and recommended wt loss of 10% of body weight  over the next 6 months using a low fat, fruit/vegetable based Mediteranean diet and regular exercise a minimum of 5 days per week.     Prediabetes Assessment & Plan: She has been using a Dexcom CBG to monitor blood sugars and notes an average blood sugar of 110  Lab Results  Component Value Date   HGBA1C 5.6 04/06/2023      Other orders -     Telmisartan; Take 1 tablet (40 mg total) by mouth at bedtime.  Dispense: 90 tablet; Refill: 0      I discussed the assessment and treatment plan with the  patient. The patient was provided an opportunity to ask questions and all were answered. The patient agreed with the plan and demonstrated an understanding of the instructions.   The patient was advised to call back or seek an in-person evaluation if the symptoms worsen or if the condition fails to improve as anticipated.   I spent 30 minutes dedicated to the care of this patient on the date of this encounter to include pre-visit review of her medical history,  Face-to-face time with the patient , and post visit ordering of testing and therapeutics.    Sherlene Shams, MD

## 2023-04-13 NOTE — Assessment & Plan Note (Signed)
She reports a weight gain  due to lack of exercise. I have addressed  BMI and recommended wt loss of 10% of body weight over the next 6 months using a low fat, fruit/vegetable based Mediteranean diet and regular exercise a minimum of 5 days per week.

## 2023-04-26 ENCOUNTER — Other Ambulatory Visit (INDEPENDENT_AMBULATORY_CARE_PROVIDER_SITE_OTHER): Payer: Medicare Other

## 2023-04-26 DIAGNOSIS — I10 Essential (primary) hypertension: Secondary | ICD-10-CM | POA: Diagnosis not present

## 2023-04-26 LAB — BASIC METABOLIC PANEL
BUN: 19 mg/dL (ref 6–23)
CO2: 28 meq/L (ref 19–32)
Calcium: 9.3 mg/dL (ref 8.4–10.5)
Chloride: 101 meq/L (ref 96–112)
Creatinine, Ser: 0.88 mg/dL (ref 0.40–1.20)
GFR: 66.46 mL/min (ref >60.00)
Glucose, Bld: 92 mg/dL (ref 70–99)
Potassium: 4.2 meq/L (ref 3.5–5.1)
Sodium: 138 meq/L (ref 135–145)

## 2023-07-10 ENCOUNTER — Other Ambulatory Visit: Payer: Self-pay | Admitting: Internal Medicine

## 2023-09-29 ENCOUNTER — Telehealth: Admitting: Physician Assistant

## 2023-09-29 DIAGNOSIS — R3989 Other symptoms and signs involving the genitourinary system: Secondary | ICD-10-CM

## 2023-09-29 MED ORDER — CEPHALEXIN 500 MG PO CAPS
500.0000 mg | ORAL_CAPSULE | Freq: Two times a day (BID) | ORAL | 0 refills | Status: DC
Start: 2023-09-29 — End: 2023-10-12

## 2023-09-29 MED ORDER — PHENAZOPYRIDINE HCL 100 MG PO TABS: 100.0000 mg | ORAL_TABLET | Freq: Three times a day (TID) | ORAL | 0 refills | Status: DC | PRN

## 2023-09-29 NOTE — Progress Notes (Signed)
 Virtual Visit Consent   Alexandria Graves, you are scheduled for a virtual visit with a [facility] provider today. Just as with appointments in the office, your consent must be obtained to participate. Your consent will be active for this visit and any virtual visit you may have with one of our providers in the next 365 days. If you have a MyChart account, a copy of this consent can be sent to you electronically.  As this is a virtual visit, video technology does not allow for your provider to perform a traditional examination. This may limit your provider's ability to fully assess your condition. If your provider identifies any concerns that need to be evaluated in person or the need to arrange testing (such as labs, EKG, etc.), we will make arrangements to do so. Although advances in technology are sophisticated, we cannot ensure that it will always work on either your end or our end. If the connection with a video visit is poor, the visit may have to be switched to a telephone visit. With either a video or telephone visit, we are not always able to ensure that we have a secure connection.  By engaging in this virtual visit, you consent to the provision of healthcare and authorize for your insurance to be billed (if applicable) for the services provided during this visit. Depending on your insurance coverage, you may receive a charge related to this service.  I need to obtain your verbal consent now. Are you willing to proceed with your visit today? Alexandria Graves has provided verbal consent on 09/29/2023 for a virtual visit (video or telephone). Angelia Kelp, PA-C  Date: 09/29/2023 6:22 PM   Virtual Visit via Video Note   I, Angelia Kelp, connected with  Alexandria Graves  ([id number], 27-Oct-1950) on 09/29/23 at  6:30 PM EDT by a video-enabled telemedicine application and verified that I am speaking with the correct person using two identifiers.  Location: Patient: Virtual Visit Location  Patient: Home Provider: Virtual Visit Location Provider: Home Office   I discussed the limitations of evaluation and management by telemedicine and the availability of in person appointments. The patient expressed understanding and agreed to proceed.    History of Present Illness: Alexandria Graves is a 73 y.o. who identifies as a female who was assigned female at birth, and is being seen today for dysuria and frequency.  HPI: Urinary Tract Infection  This is a new problem. The current episode started today. The problem has been gradually worsening. The quality of the pain is described as burning and aching. The pain is mild. There has been no fever. Associated symptoms include frequency, hematuria, hesitancy and urgency. Pertinent negatives include no chills, discharge, flank pain, nausea or vomiting. She has tried increased fluids (AZO) for the symptoms. The treatment provided no relief.     Problems:  Patient Active Problem List   Diagnosis Date Noted   Skin neoplasm 03/09/2016   Wellness examination 02/02/2015   History of shingles 04/20/2013   Edema 01/19/2013   Prediabetes 01/19/2013   Lower esophageal ring ([place]) 03/09/2012   History of migraine headaches    Hypertension    Hyperlipidemia    Overweight (BMI 25.0-29.9) 08/26/2011    Allergies: No Known Allergies Medications:  Current Outpatient Medications:    cephALEXin (KEFLEX) 500 MG capsule, Take 1 capsule (500 mg total) by mouth 2 (two) times daily., Disp: 14 capsule, Rfl: 0   phenazopyridine (PYRIDIUM) 100 MG tablet, Take 1  tablet (100 mg total) by mouth 3 (three) times daily as needed for pain., Disp: 10 tablet, Rfl: 0   cetirizine (ZYRTEC) 10 MG tablet, Take 10 mg by mouth daily., Disp: , Rfl:    telmisartan  (MICARDIS ) 40 MG tablet, TAKE 1 TABLET(40 MG) BY MOUTH AT BEDTIME, Disp: 90 tablet, Rfl: 1  Observations/Objective: Patient is well-developed, well-nourished in no acute distress.  Resting comfortably at home.   Head is normocephalic, atraumatic.  No labored breathing.  Speech is clear and coherent with logical content.  Patient is alert and oriented at baseline.    Assessment and Plan: 1. Suspected UTI (Primary) - cephALEXin (KEFLEX) 500 MG capsule; Take 1 capsule (500 mg total) by mouth 2 (two) times daily.  Dispense: 14 capsule; Refill: 0 - phenazopyridine (PYRIDIUM) 100 MG tablet; Take 1 tablet (100 mg total) by mouth 3 (three) times daily as needed for pain.  Dispense: 10 tablet; Refill: 0  - Worsening symptoms.  - Will treat empirically with Keflex - May use Pyridium for bladder spasms - Continue to push fluids.  - Seek in person evaluation for urine culture if symptoms do not improve or if they worsen.    Follow Up Instructions: I discussed the assessment and treatment plan with the patient. The patient was provided an opportunity to ask questions and all were answered. The patient agreed with the plan and demonstrated an understanding of the instructions.  A copy of instructions were sent to the patient via MyChart unless otherwise noted below.    The patient was advised to call back or seek an in-person evaluation if the symptoms worsen or if the condition fails to improve as anticipated.    Angelia Kelp, PA-C

## 2023-09-29 NOTE — Patient Instructions (Signed)
 Alexandria Graves, thank you for joining Angelia Kelp, PA-C for today's virtual visit.  While this provider is not your primary care provider (PCP), if your PCP is located in our provider database this encounter information will be shared with them immediately following your visit.   A [facility] MyChart account gives you access to today's visit and all your visits, tests, and labs performed at [facility] " click here if you don't have a [facility] MyChart account or go to [email or web address]  Consent: (Patient) Alexandria Graves provided verbal consent for this virtual visit at the beginning of the encounter.  Current Medications:  Current Outpatient Medications:    cephALEXin (KEFLEX) 500 MG capsule, Take 1 capsule (500 mg total) by mouth 2 (two) times daily., Disp: 14 capsule, Rfl: 0   phenazopyridine (PYRIDIUM) 100 MG tablet, Take 1 tablet (100 mg total) by mouth 3 (three) times daily as needed for pain., Disp: 10 tablet, Rfl: 0   cetirizine (ZYRTEC) 10 MG tablet, Take 10 mg by mouth daily., Disp: , Rfl:    telmisartan  (MICARDIS ) 40 MG tablet, TAKE 1 TABLET(40 MG) BY MOUTH AT BEDTIME, Disp: 90 tablet, Rfl: 1   Medications ordered in this encounter:  Meds ordered this encounter  Medications   cephALEXin (KEFLEX) 500 MG capsule    Sig: Take 1 capsule (500 mg total) by mouth 2 (two) times daily.    Dispense:  14 capsule    Refill:  0    Supervising Provider:   Corine Dice [[id number]]   phenazopyridine (PYRIDIUM) 100 MG tablet    Sig: Take 1 tablet (100 mg total) by mouth 3 (three) times daily as needed for pain.    Dispense:  10 tablet    Refill:  0    Supervising Provider:   Corine Dice [[id number]]     *If you need refills on other medications prior to your next appointment, please contact your pharmacy*  Follow-Up: Call back or seek an in-person evaluation if the symptoms worsen or if the condition fails to improve as anticipated.  [place]  [facility] [phone]  Other Instructions Urinary Tract Infection, Female A urinary tract infection (UTI) is an infection in your urinary tract. The urinary tract is made up of organs that make, store, and get rid of pee (urine) in your body. These organs include: The kidneys. The ureters. The bladder. The urethra. What are the causes? Most UTIs are caused by germs called bacteria. They may be in or near your genitals. These germs grow and cause swelling in your urinary tract. What increases the risk? You're more likely to get a UTI if: You're a female. The urethra is shorter in females than in males. You have a soft tube called a catheter that drains your pee. You can't control when you pee or poop. You have trouble peeing because of: A kidney stone. A urinary blockage. A nerve condition that affects your bladder. Not getting enough to drink. You're sexually active. You use a birth control inside your vagina, like spermicide. You're pregnant. You have low levels of the hormone estrogen in your body. You're an older adult. You're also more likely to get a UTI if you have other health problems. These may include: Diabetes. A weak immune system. Your immune system is your body's defense system. Sickle cell disease. Injury of the spine. What are the signs or symptoms? Symptoms may include: Needing to pee right away. Peeing small amounts often. Pain or  burning when you pee. Blood in your pee. Pee that smells bad or odd. Pain in your belly or lower back. You may also: Feel confused. This may be the first symptom in older adults. Vomit. Not feel hungry. Feel tired or easily annoyed. Have a fever or chills. How is this diagnosed? A UTI is diagnosed based on your medical history and an exam. You may also have other tests. These may include: Pee tests. Blood tests. Tests for sexually transmitted infections (STIs). If you've had more than one UTI, you may need to have  imaging studies done to find out why you keep getting them. How is this treated? A UTI can be treated by: Taking antibiotics or other medicines. Drinking enough fluid to keep your pee pale yellow. In rare cases, a UTI can cause a very bad condition called sepsis. Sepsis may be treated in the hospital. Follow these instructions at home: Medicines Take your medicines only as told by your health care provider. If you were given antibiotics, take them as told by your provider. Do not stop taking them even if you start to feel better. General instructions Make sure you: Pee often and fully. Do not hold your pee for a long time. Wipe from front to back after you pee or poop. Use each tissue only once when you wipe. Pee after you have sex. Do not douche or use sprays or powders in your genital area. Contact a health care provider if: Your symptoms don't get better after 1-2 days of taking antibiotics. Your symptoms go away and then come back. You have a fever or chills. You vomit or feel like you may vomit. Get help right away if: You have very bad pain in your back or lower belly. You faint. This information is not intended to replace advice given to you by your health care provider. Make sure you discuss any questions you have with your health care provider. Document Revised: 12/10/2022 Document Reviewed: 08/07/2022 Elsevier Patient Education  2024 Elsevier Inc.   If you have been instructed to have an in-person evaluation today at a local Urgent Care facility, please use the link below. It will take you to a list of all of our available [facility], including address, phone number and hours of operation. Please do not delay care.  [facility]  If you or a family member do not have a primary care provider, use the link below to schedule a visit and establish care. When you choose a [facility] primary care physician or advanced practice provider, you gain a  long-term partner in health. Find a Primary Care Provider  Learn more about [facility]'s in-office and virtual care options: [facility] - Get Care Now

## 2023-10-04 ENCOUNTER — Encounter: Payer: Self-pay | Admitting: Internal Medicine

## 2023-10-04 DIAGNOSIS — R5383 Other fatigue: Secondary | ICD-10-CM

## 2023-10-04 DIAGNOSIS — R7303 Prediabetes: Secondary | ICD-10-CM

## 2023-10-04 DIAGNOSIS — E782 Mixed hyperlipidemia: Secondary | ICD-10-CM

## 2023-10-04 DIAGNOSIS — I10 Essential (primary) hypertension: Secondary | ICD-10-CM

## 2023-10-05 NOTE — Telephone Encounter (Signed)
 Pt has already returned for the BMET after starting the Telmisartan  in February. I believe she is now due for routine 6 month fasting labs. I have pended them for your approval. If needed I will call pt to get her scheduled.

## 2023-10-10 ENCOUNTER — Other Ambulatory Visit: Payer: Self-pay | Admitting: Internal Medicine

## 2023-10-12 ENCOUNTER — Other Ambulatory Visit: Payer: Self-pay | Admitting: Nurse Practitioner

## 2023-10-12 ENCOUNTER — Telehealth: Payer: Self-pay

## 2023-10-12 ENCOUNTER — Other Ambulatory Visit
Admission: RE | Admit: 2023-10-12 | Discharge: 2023-10-12 | Disposition: A | Discharge status: Home / Self Care | Attending: Nurse Practitioner | Admitting: Nurse Practitioner

## 2023-10-12 ENCOUNTER — Ambulatory Visit: Payer: Self-pay

## 2023-10-12 ENCOUNTER — Encounter: Payer: Self-pay | Admitting: Nurse Practitioner

## 2023-10-12 ENCOUNTER — Ambulatory Visit: Payer: Self-pay | Admitting: Nurse Practitioner

## 2023-10-12 ENCOUNTER — Telehealth (INDEPENDENT_AMBULATORY_CARE_PROVIDER_SITE_OTHER): Admitting: Nurse Practitioner

## 2023-10-12 VITALS — BP 145/80 | HR 65 | Ht 62.0 in | Wt 165.0 lb

## 2023-10-12 DIAGNOSIS — R319 Hematuria, unspecified: Secondary | ICD-10-CM

## 2023-10-12 DIAGNOSIS — R310 Gross hematuria: Secondary | ICD-10-CM | POA: Diagnosis not present

## 2023-10-12 DIAGNOSIS — R350 Frequency of micturition: Secondary | ICD-10-CM

## 2023-10-12 LAB — URINALYSIS, COMPLETE (UACMP) WITH MICROSCOPIC
Bilirubin Urine: NEGATIVE
Glucose, UA: NEGATIVE mg/dL
Ketones, ur: NEGATIVE mg/dL
Nitrite: NEGATIVE
Protein, ur: NEGATIVE mg/dL
Specific Gravity, Urine: <1.005 — ABNORMAL LOW (ref 1.005–1.030)
pH: 5.5 (ref 5.0–8.0)

## 2023-10-12 MED ORDER — NITROFURANTOIN MONOHYD MACRO 100 MG PO CAPS: 100.0000 mg | ORAL_CAPSULE | Freq: Two times a day (BID) | ORAL | 0 refills | Status: DC

## 2023-10-12 NOTE — Assessment & Plan Note (Signed)
 Recurrent UTI symptoms with hematuria, urgency, and frequency. Previous cephalexin  course provided initial relief, but symptoms recurred. No fever, back pain, or dysuria. - Urinalysis and culture pending.  -Instruct her to provide urine sample at [facility] lab in [place]. -Will treat according to the urinalysis result.

## 2023-10-12 NOTE — Progress Notes (Signed)
 Urinalysis positive for leukocytes , bacteria and moderate Hgb. Will treat with Macrobid BID x 7 days.

## 2023-10-12 NOTE — Telephone Encounter (Signed)
  Chief Complaint: hematuria Symptoms: urinary frequency Frequency: x today Pertinent Negatives: Patient denies abdominal pain, back or flank pain, fever, nausea, vomiting Disposition: [] ED /[] Urgent Care (no appt availability in office) / [x] Appointment(In office/virtual)/ []  [facility]/ [] Home Care/ [] Refused Recommended Disposition /[] [facility]/ []  Follow-up with PCP Additional Notes: Patient seen via telehealth for suspected UTI on 09/29/23. She completed her antibiotic and states her symptoms returned today. She states she can not come in person for an appt and is requesting another antibiotic to treat the infection. Patient scheduled for virtual visit with available provider at 4pm today.  Copied from CRM [id number]. Topic: Clinical - Red Word Triage >> Oct 12, 2023  1:31 PM Alexandria Graves wrote: Red Word that prompted transfer to Nurse Triage: Pt states that she was seen on 5/14 for UTI symptoms and it went away for a while and now is back as of today (5/27).  She is passing blood once again with her urine. Pain and urgency as well and she finished the 7-day antibiotics and is still experiencing symptoms. Reason for Disposition  Pain or burning with passing urine  Answer Assessment - Initial Assessment Questions 1. COLOR of URINE: "Describe the color of the urine."  (e.g., tea-colored, pink, red, bloody) "Do you have blood clots in your urine?" (e.g., none, pea, grape, small coin)     Pink, denies blood clots.  2. ONSET: "When did the bleeding start?"      Today.  3. EPISODES: "How many times has there been blood in the urine?" or "How many times today?"     Twice.  4. PAIN with URINATION: "Is there any pain with passing your urine?" If Yes, ask: "How bad is the pain?"  (Scale 1-10; or mild, moderate, severe)    - MILD: Complains slightly about urination hurting.    - MODERATE: Interferes with normal activities.      - SEVERE: Excruciating, unwilling or unable to  urinate because of the pain.      Cramp/spasm pain when urinating or right after urinating. Mild.  5. FEVER: "Do you have a fever?" If Yes, ask: "What is your temperature, how was it measured, and when did it start?"     No.  6. ASSOCIATED SYMPTOMS: "Are you passing urine more frequently than usual?"     Yes.  7. OTHER SYMPTOMS: "Do you have any other symptoms?" (e.g., back/flank pain, abdomen pain, vomiting)    No.  8. PREGNANCY: "Is there any chance you are pregnant?" "When was your last menstrual period?"     N/A.  Protocols used: Urine - Blood In-A-AH

## 2023-10-12 NOTE — Telephone Encounter (Signed)
 Patient called back, reported that her husband picked up the urine cup and dropped off the urine sample at the [facility] lab in [place]. Staff there said that they could not see the orders entered by Tona Francis, NP.  Please follow up Wednesday.

## 2023-10-12 NOTE — Progress Notes (Signed)
   Virtual Visit via Video Note  I connected with Alexandria Graves on 10/12/23 at 4:34 PM by a video enabled telemedicine application and verified that I am speaking with the correct person using two identifiers.  Patient Location: Home Provider Location: Office/Clinic  I discussed the limitations, risks, security, and privacy concerns of performing an evaluation and management service by video and the availability of in person appointments. I also discussed with the patient that there may be a patient responsible charge related to this service. The patient expressed understanding and agreed to proceed.  Subjective: PCP: Thersia Flax, MD  Chief Complaint  Patient presents with   Urinary Frequency    Started today   Hematuria  Discussed the use of a AI scribe software for clinical note transcription with the patient, who gave verbal consent to proceed.  HPI  Alexandria Graves is a 73 year old female who presents with symptoms of a recurrent urinary tract infection.  She experiences hematuria, urinary urgency, frequency  and pain. She was treated with Keflex  and phenazopyridine  for UTI via video visit with on 09/29/23. Her symptoms initially improved but  returned a few days after completing the antibiotic course.  Currently, hematuria is less severe, with urine appearing pink. She has consumed about eighty ounces of water today, which may have diluted the urine. Pink staining is noted on her panty liner.  There is no back or suprapubic pain, but cramps and spasms occur   ROS: Per HPI  Current Outpatient Medications:    cetirizine (ZYRTEC) 10 MG tablet, Take 10 mg by mouth daily., Disp: , Rfl:    Cranberry Extract 250 MG TABS, Take 1 capsule by mouth as needed., Disp: , Rfl:    D-Mannose 500 MG CAPS, Take 3 capsules by mouth in the morning, at noon, and at bedtime., Disp: , Rfl:    telmisartan  (MICARDIS ) 40 MG tablet, TAKE 1 TABLET(40 MG) BY MOUTH AT BEDTIME, Disp: 90 tablet, Rfl: 1    phenazopyridine  (PYRIDIUM ) 100 MG tablet, Take 1 tablet (100 mg total) by mouth 3 (three) times daily as needed for pain. (Patient not taking: Reported on 10/12/2023), Disp: 10 tablet, Rfl: 0  Observations/Objective: Today's Vitals   10/12/23 1551  BP: (!) 145/80  Pulse: 65  Weight: 165 lb (74.8 kg)  Height: 5\' 2"  (1.575 m)   Physical Exam  Assessment and Plan: Hematuria, unspecified type Assessment & Plan: Recurrent UTI symptoms with hematuria, urgency, and frequency. Previous cephalexin  course provided initial relief, but symptoms recurred. No fever, back pain, or dysuria. - Urinalysis and culture pending.  -Instruct her to provide urine sample at [facility] lab in [facility]. -Will treat according to the urinalysis result.    Urinary frequency    Follow Up Instructions: No follow-ups on file.   I discussed the assessment and treatment plan with the patient. The patient was provided an opportunity to ask questions, and all were answered. The patient agreed with the plan and demonstrated an understanding of the instructions.   The patient was advised to call back or seek an in-person evaluation if the symptoms worsen or if the condition fails to improve as anticipated.  The above assessment and management plan was discussed with the patient. The patient verbalized understanding of and has agreed to the management plan.   Croy Drumwright, NP

## 2023-10-13 LAB — URINE CULTURE: Culture: <10000 — AB

## 2023-10-13 NOTE — Telephone Encounter (Signed)
 Called Patient with urinalysis results from [facility]. Patient is aware to take the macrobid until the culture comes back and it tells what antibiotic is needed. Patient is aware that we will be back in touch when the culture comes in.

## 2023-10-22 ENCOUNTER — Other Ambulatory Visit (INDEPENDENT_AMBULATORY_CARE_PROVIDER_SITE_OTHER)

## 2023-10-22 DIAGNOSIS — I10 Essential (primary) hypertension: Secondary | ICD-10-CM

## 2023-10-22 LAB — COMPREHENSIVE METABOLIC PANEL WITH GFR
ALT: 18 U/L (ref 0–35)
AST: 21 U/L (ref 0–37)
Albumin: 4.2 g/dL (ref 3.5–5.2)
Alkaline Phosphatase: 54 U/L (ref 39–117)
BUN: 19 mg/dL (ref 6–23)
CO2: 28 meq/L (ref 19–32)
Calcium: 9.3 mg/dL (ref 8.4–10.5)
Chloride: 101 meq/L (ref 96–112)
Creatinine, Ser: 0.94 mg/dL (ref 0.40–1.20)
GFR: 61.19 mL/min (ref >60.00)
Glucose, Bld: 91 mg/dL (ref 70–99)
Potassium: 4.6 meq/L (ref 3.5–5.1)
Sodium: 135 meq/L (ref 135–145)
Total Bilirubin: 0.4 mg/dL (ref 0.2–1.2)
Total Protein: 7.3 g/dL (ref 6.0–8.3)

## 2023-10-23 ENCOUNTER — Ambulatory Visit: Payer: Self-pay | Admitting: Internal Medicine

## 2023-12-22 DIAGNOSIS — K635 Polyp of colon: Secondary | ICD-10-CM | POA: Diagnosis not present

## 2023-12-22 DIAGNOSIS — Z1211 Encounter for screening for malignant neoplasm of colon: Secondary | ICD-10-CM | POA: Diagnosis not present

## 2023-12-30 DIAGNOSIS — D126 Benign neoplasm of colon, unspecified: Secondary | ICD-10-CM | POA: Insufficient documentation

## 2023-12-30 NOTE — Assessment & Plan Note (Signed)
 No follow up  colonoscopy due to age, unless symptoms warrant evaluation

## 2024-03-29 ENCOUNTER — Ambulatory Visit: Admitting: Internal Medicine

## 2024-04-11 ENCOUNTER — Ambulatory Visit: Admitting: Internal Medicine

## 2024-04-11 ENCOUNTER — Telehealth: Payer: Self-pay

## 2024-04-11 ENCOUNTER — Encounter: Payer: Self-pay | Admitting: Internal Medicine

## 2024-04-11 VITALS — BP 128/70 | HR 70 | Ht 62.0 in | Wt 166.8 lb

## 2024-04-11 DIAGNOSIS — R5383 Other fatigue: Secondary | ICD-10-CM

## 2024-04-11 DIAGNOSIS — I10 Essential (primary) hypertension: Secondary | ICD-10-CM

## 2024-04-11 DIAGNOSIS — R7303 Prediabetes: Secondary | ICD-10-CM

## 2024-04-11 DIAGNOSIS — E782 Mixed hyperlipidemia: Secondary | ICD-10-CM | POA: Diagnosis not present

## 2024-04-11 MED ORDER — TELMISARTAN 40 MG PO TABS: ORAL_TABLET | ORAL | 3 refills | Status: AC

## 2024-04-11 NOTE — Telephone Encounter (Signed)
 Patient requests at check-out that her 4-month follow-up with Dr. Verneita Kettering be virtual.  I did schedule a virtual visit for her.  Please let us  know if we need to change to an in-office visit.

## 2024-04-11 NOTE — Progress Notes (Unsigned)
 Subjective:  Patient ID: Alexandria Graves, female    DOB: 01/16/1951  Age: 73 y.o. MRN: [id number]  CC: The primary encounter diagnosis was Primary hypertension. Diagnoses of Moderate mixed hyperlipidemia not requiring statin therapy, Prediabetes, and Other fatigue were also pertinent to this visit.   HPI Alexandria Graves presents for  Chief Complaint  Patient presents with  . Medical Management of Chronic Issues    Follow up on blood pressure   1) HTN:  Hypertension: patient checks blood pressure twice weekly at home.  Readings have been for the most part <130/80 at rest . Patient is following a reduced salt diet most days and is taking medications as prescribed   2) Obesity:   EXERCISING 6 DAYS PER WEEK   3) had a  kidney stone  AFTER RETURNING FROM [place]   4) SHE HAS DECLINED FUTURE MAMMOGRAMS .  HUSBAND HAS KIDNEY CANCER.  SHE WOULD NOT HAVE MASTECTOMY OR TREATMENT   Outpatient Medications Prior to Visit  Medication Sig Dispense Refill  . cetirizine (ZYRTEC) 10 MG tablet Take 10 mg by mouth daily. (Patient taking differently: Take 10 mg by mouth every other day.)    . telmisartan  (MICARDIS ) 40 MG tablet TAKE 1 TABLET(40 MG) BY MOUTH AT BEDTIME 90 tablet 1  . Cranberry Extract 250 MG TABS Take 1 capsule by mouth as needed.    . D-Mannose 500 MG CAPS Take 3 capsules by mouth in the morning, at noon, and at bedtime.    . nitrofurantoin , macrocrystal-monohydrate, (MACROBID ) 100 MG capsule Take 1 capsule (100 mg total) by mouth 2 (two) times daily. 14 capsule 0  . phenazopyridine  (PYRIDIUM ) 100 MG tablet Take 1 tablet (100 mg total) by mouth 3 (three) times daily as needed for pain. (Patient not taking: Reported on 10/12/2023) 10 tablet 0   No facility-administered medications prior to visit.    Review of Systems;  Patient denies headache, fevers, malaise, unintentional weight loss, skin rash, eye pain, sinus congestion and sinus pain, sore throat, dysphagia,  hemoptysis , cough,  dyspnea, wheezing, chest pain, palpitations, orthopnea, edema, abdominal pain, nausea, melena, diarrhea, constipation, flank pain, dysuria, hematuria, urinary  Frequency, nocturia, numbness, tingling, seizures,  Focal weakness, Loss of consciousness,  Tremor, insomnia, depression, anxiety, and suicidal ideation.      Objective:  BP 128/70   Pulse 70   Ht 5' 2 (1.575 m)   Wt 166 lb 12.8 oz (75.7 kg)   SpO2 95%   BMI 30.51 kg/m   BP Readings from Last 3 Encounters:  04/11/24 128/70  10/12/23 (!) 145/80  04/13/23 (!) 146/80    Wt Readings from Last 3 Encounters:  04/11/24 166 lb 12.8 oz (75.7 kg)  10/12/23 165 lb (74.8 kg)  04/13/23 162 lb 1.6 oz (73.5 kg)    Physical Exam Vitals reviewed.  Constitutional:      General: She is not in acute distress.    Appearance: Normal appearance. She is normal weight. She is not ill-appearing, toxic-appearing or diaphoretic.  HENT:     Head: Normocephalic.  Eyes:     General: No scleral icterus.       Right eye: No discharge.        Left eye: No discharge.     Conjunctiva/sclera: Conjunctivae normal.  Cardiovascular:     Rate and Rhythm: Normal rate and regular rhythm.     Heart sounds: Normal heart sounds.  Pulmonary:     Effort: Pulmonary effort is normal. No respiratory distress.  Breath sounds: Normal breath sounds.  Musculoskeletal:        General: Normal range of motion.  Skin:    General: Skin is warm and dry.  Neurological:     General: No focal deficit present.     Mental Status: She is alert and oriented to person, place, and time. Mental status is at baseline.  Psychiatric:        Mood and Affect: Mood normal.        Behavior: Behavior normal.        Thought Content: Thought content normal.        Judgment: Judgment normal.    Lab Results  Component Value Date   HGBA1C 5.4 04/11/2024   HGBA1C 5.6 04/06/2023   HGBA1C 5.9 08/06/2021    Lab Results  Component Value Date   CREATININE 0.78 04/11/2024    CREATININE 0.94 10/22/2023   CREATININE 0.88 04/26/2023    Lab Results  Component Value Date   WBC 7.5 04/11/2024   HGB 14.3 04/11/2024   HCT 42.8 04/11/2024   PLT 249.0 04/11/2024   GLUCOSE 71 04/11/2024   CHOL 258 (H) 04/11/2024   TRIG 81.0 04/11/2024   HDL 58.90 04/11/2024   LDLDIRECT 178.0 04/11/2024   LDLCALC 183 (H) 04/11/2024   ALT 18 04/11/2024   AST 20 04/11/2024   NA 135 04/11/2024   K 4.2 04/11/2024   CL 99 04/11/2024   CREATININE 0.78 04/11/2024   BUN 15 04/11/2024   CO2 25 04/11/2024   TSH 1.00 04/11/2024   HGBA1C 5.4 04/11/2024   MICROALBUR <0.7 04/11/2024    No results found.  Assessment & Plan:  .Primary hypertension Assessment & Plan: Well controlled on current regimen of telmisartan  40 mg daily.   Renal function stable, no changes today.   Lab Results  Component Value Date   CREATININE 0.78 04/11/2024   Lab Results  Component Value Date   NA 135 04/11/2024   K 4.2 04/11/2024   CL 99 04/11/2024   CO2 25 04/11/2024     Orders: -     Comprehensive metabolic panel with GFR -     Microalbumin / creatinine urine ratio  Moderate mixed hyperlipidemia not requiring statin therapy Assessment & Plan: 10 yr risk of CAD using the AHA risk calculator is 13%.  Patient has no incidental evidence of atherosclerosis and  Cardiac CT was done and her calcium  score was zero. No therapy indicated     Lab Results  Component Value Date   CHOL 258 (H) 04/11/2024   HDL 58.90 04/11/2024   LDLCALC 183 (H) 04/11/2024   LDLDIRECT 178.0 04/11/2024   TRIG 81.0 04/11/2024   CHOLHDL 4 04/11/2024     Orders: -     Lipid panel -     LDL cholesterol, direct  Prediabetes -     Comprehensive metabolic panel with GFR -     Hemoglobin A1c  Other fatigue -     CBC with Differential/Platelet -     TSH  Other orders -     Telmisartan ; TAKE 1 TABLET(40 MG) BY MOUTH AT BEDTIME  Dispense: 90 tablet; Refill: 3     I spent 34 minutes on the day of this face to  face encounter reviewing patient's  most recent visit with cardiology,  nephrology,  and neurology,  prior relevant surgical and non surgical procedures, recent  labs and imaging studies, counseling on weight management,  reviewing the assessment and plan with patient, and post visit  ordering and reviewing of  diagnostics and therapeutics with patient  .   Follow-up: Return in about 6 months (around 10/09/2024).   Verneita LITTIE Kettering, MD

## 2024-04-12 LAB — CBC WITH DIFFERENTIAL/PLATELET
Basophils Absolute: 0.1 K/uL (ref 0.0–0.1)
Basophils Relative: 1.2 % (ref 0.0–3.0)
Eosinophils Absolute: 0.2 K/uL (ref 0.0–0.7)
Eosinophils Relative: 3.1 % (ref 0.0–5.0)
HCT: 42.8 % (ref 36.0–46.0)
Hemoglobin: 14.3 g/dL (ref 12.0–15.0)
Lymphocytes Relative: 27.4 % (ref 12.0–46.0)
Lymphs Abs: 2.1 K/uL (ref 0.7–4.0)
MCHC: 33.5 g/dL (ref 30.0–36.0)
MCV: 83.3 fl (ref 78.0–100.0)
Monocytes Absolute: 0.6 K/uL (ref 0.1–1.0)
Monocytes Relative: 7.6 % (ref 3.0–12.0)
Neutro Abs: 4.6 K/uL (ref 1.4–7.7)
Neutrophils Relative %: 60.7 % (ref 43.0–77.0)
Platelets: 249 K/uL (ref 150.0–400.0)
RBC: 5.14 Mil/uL — ABNORMAL HIGH (ref 3.87–5.11)
RDW: 13.2 % (ref 11.5–15.5)
WBC: 7.5 K/uL (ref 4.0–10.5)

## 2024-04-12 LAB — LIPID PANEL
Cholesterol: 258 mg/dL — ABNORMAL HIGH (ref 0–200)
HDL: 58.9 mg/dL (ref >39.00)
LDL Cholesterol: 183 mg/dL — ABNORMAL HIGH (ref 0–99)
NonHDL: 198.92
Total CHOL/HDL Ratio: 4
Triglycerides: 81 mg/dL (ref 0.0–149.0)
VLDL: 16.2 mg/dL (ref 0.0–40.0)

## 2024-04-12 LAB — HEMOGLOBIN A1C: Hgb A1c MFr Bld: 5.4 % (ref 4.6–6.5)

## 2024-04-12 LAB — COMPREHENSIVE METABOLIC PANEL WITH GFR
ALT: 18 U/L (ref 0–35)
AST: 20 U/L (ref 0–37)
Albumin: 4.7 g/dL (ref 3.5–5.2)
Alkaline Phosphatase: 48 U/L (ref 39–117)
BUN: 15 mg/dL (ref 6–23)
CO2: 25 meq/L (ref 19–32)
Calcium: 9.8 mg/dL (ref 8.4–10.5)
Chloride: 99 meq/L (ref 96–112)
Creatinine, Ser: 0.78 mg/dL (ref 0.40–1.20)
GFR: 76.29 mL/min (ref >60.00)
Glucose, Bld: 71 mg/dL (ref 70–99)
Potassium: 4.2 meq/L (ref 3.5–5.1)
Sodium: 135 meq/L (ref 135–145)
Total Bilirubin: 0.8 mg/dL (ref 0.2–1.2)
Total Protein: 7.5 g/dL (ref 6.0–8.3)

## 2024-04-12 LAB — MICROALBUMIN / CREATININE URINE RATIO
Creatinine,U: 60.1 mg/dL
Microalb Creat Ratio: UNDETERMINED mg/g (ref 0.0–30.0)
Microalb, Ur: <0.7 mg/dL

## 2024-04-12 LAB — LDL CHOLESTEROL, DIRECT: Direct LDL: 178 mg/dL

## 2024-04-12 LAB — TSH: TSH: 1 u[IU]/mL (ref 0.35–5.50)

## 2024-04-12 NOTE — Telephone Encounter (Signed)
 Is a virtual visit okay for pt's 6 month follow up?

## 2024-04-12 NOTE — Telephone Encounter (Signed)
 noted

## 2024-04-13 NOTE — Assessment & Plan Note (Signed)
 10 yr risk of CAD using the AHA risk calculator is 13%.  Patient has no incidental evidence of atherosclerosis and  Cardiac CT was done and her calcium  score was zero. No therapy indicated     Lab Results  Component Value Date   CHOL 258 (H) 04/11/2024   HDL 58.90 04/11/2024   LDLCALC 183 (H) 04/11/2024   LDLDIRECT 178.0 04/11/2024   TRIG 81.0 04/11/2024   CHOLHDL 4 04/11/2024

## 2024-04-13 NOTE — Assessment & Plan Note (Signed)
 Well controlled on current regimen of telmisartan  40 mg daily.   Renal function stable, no changes today.   Lab Results  Component Value Date   CREATININE 0.78 04/11/2024   Lab Results  Component Value Date   NA 135 04/11/2024   K 4.2 04/11/2024   CL 99 04/11/2024   CO2 25 04/11/2024

## 2024-04-14 ENCOUNTER — Ambulatory Visit: Payer: Self-pay | Admitting: Internal Medicine

## 2024-05-30 ENCOUNTER — Ambulatory Visit: Admitting: Internal Medicine

## 2024-10-10 ENCOUNTER — Ambulatory Visit: Admitting: Internal Medicine
